# Patient Record
Sex: Male | Born: 1970 | Race: Black or African American | Hispanic: No | Marital: Single | Smoking: Never smoker
Health system: Southern US, Community
[De-identification: ages and names within clinical notes are randomized; demographics above are authoritative.]

## PROBLEM LIST (undated history)

## (undated) HISTORY — PX: COLONOSCOPY: SHX174

## (undated) HISTORY — PX: MOUTH SURGERY: SHX715

---

## 1999-04-30 ENCOUNTER — Encounter: Payer: Self-pay | Admitting: Emergency Medicine

## 1999-04-30 ENCOUNTER — Emergency Department (HOSPITAL_COMMUNITY): Admission: EM | Admit: 1999-04-30 | Discharge: 1999-04-30 | Payer: Self-pay | Admitting: Emergency Medicine

## 1999-10-03 ENCOUNTER — Emergency Department (HOSPITAL_COMMUNITY): Admission: EM | Admit: 1999-10-03 | Discharge: 1999-10-03 | Payer: Self-pay | Admitting: Emergency Medicine

## 2004-01-12 ENCOUNTER — Observation Stay (HOSPITAL_COMMUNITY): Admission: AC | Admit: 2004-01-12 | Discharge: 2004-01-13 | Payer: Self-pay
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
## 2007-05-03 ENCOUNTER — Emergency Department (HOSPITAL_COMMUNITY): Admission: EM | Admit: 2007-05-03 | Discharge: 2007-05-03 | Payer: Self-pay | Admitting: Emergency Medicine

## 2010-01-20 ENCOUNTER — Emergency Department (HOSPITAL_COMMUNITY): Admission: EM | Admit: 2010-01-20 | Discharge: 2010-01-20 | Payer: Self-pay | Admitting: Emergency Medicine
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
## 2011-05-07 ENCOUNTER — Inpatient Hospital Stay (INDEPENDENT_AMBULATORY_CARE_PROVIDER_SITE_OTHER)
Admission: RE | Admit: 2011-05-07 | Discharge: 2011-05-07 | Disposition: A | Discharge status: Home / Self Care | Payer: Self-pay | Source: Ambulatory Visit | Attending: Family Medicine | Admitting: Family Medicine

## 2011-05-07 DIAGNOSIS — T7840XA Allergy, unspecified, initial encounter: Secondary | ICD-10-CM

## 2014-09-15 ENCOUNTER — Encounter (HOSPITAL_COMMUNITY): Payer: Self-pay | Admitting: Emergency Medicine

## 2014-09-15 ENCOUNTER — Emergency Department (INDEPENDENT_AMBULATORY_CARE_PROVIDER_SITE_OTHER)
Admission: EM | Admit: 2014-09-15 | Discharge: 2014-09-15 | Disposition: A | Discharge status: Home / Self Care | Payer: Self-pay | Source: Home / Self Care | Attending: Family Medicine | Admitting: Family Medicine

## 2014-09-15 DIAGNOSIS — M62830 Muscle spasm of back: Secondary | ICD-10-CM

## 2014-09-15 LAB — POCT I-STAT, CHEM 8
BUN: 21 mg/dL (ref 6–23)
CREATININE: 1.3 mg/dL (ref 0.50–1.35)
Calcium, Ion: 1.19 mmol/L (ref 1.12–1.23)
Chloride: 101 mEq/L (ref 96–112)
GLUCOSE: 85 mg/dL (ref 70–99)
HCT: 48 % (ref 39.0–52.0)
HEMOGLOBIN: 16.3 g/dL (ref 13.0–17.0)
Potassium: 3.7 mmol/L (ref 3.5–5.1)
Sodium: 140 mmol/L (ref 135–145)
TCO2: 24 mmol/L (ref 0–100)

## 2014-09-15 MED ORDER — KETOROLAC TROMETHAMINE 60 MG/2ML IM SOLN
INTRAMUSCULAR | Status: AC
  Filled 2014-09-15: qty 2

## 2014-09-15 MED ORDER — KETOROLAC TROMETHAMINE 60 MG/2ML IM SOLN
60.0000 mg | Freq: Once | INTRAMUSCULAR | Status: AC
  Administered 2014-09-15: 60 mg via INTRAMUSCULAR

## 2014-09-15 MED ORDER — METHOCARBAMOL 500 MG PO TABS
500.0000 mg | ORAL_TABLET | Freq: Four times a day (QID) | ORAL | Status: DC | PRN
Start: 2014-09-15 — End: 2014-09-15

## 2014-09-15 MED ORDER — METHOCARBAMOL 500 MG PO TABS: 500.0000 mg | ORAL_TABLET | Freq: Four times a day (QID) | ORAL | Status: DC | PRN

## 2014-09-15 NOTE — Discharge Instructions (Signed)
You are suffering from back spasms This will take several days to resolve  Stay active and remember if it hurts continue, if it hurts a lot stop. Try applying heat and cold, massage, and stretch multiple times per day.  You were given a shot of toradol for the pain and inflammation In 24hours start taking ibuprofen (or advil) 600-800mg  every 6-8 hours Please take the muscle relaxer for the spasms. This may make you tired.

## 2014-09-15 NOTE — ED Provider Notes (Signed)
CSN: [id number]     Arrival date & time 09/15/14  1624 History   First MD Initiated Contact with Patient 09/15/14 1632     Chief Complaint  Patient presents with  . Back Pain   (Consider location/radiation/quality/duration/timing/severity/associated sxs/prior Treatment) HPI  Lower back pain: lifting furniture for mother. Pain come on acutely after putting down a piece of furniture. Occurred around 14:30. Minimally improved since onset. Worse w/ certain movements. Has not taking anything for the pain. Used Icy hot w/ benefit. Non-radiating pain. Worse w/ deep breathing. No h/o previous injury.   History reviewed. No pertinent past medical history. History reviewed. No pertinent past surgical history. Family History  Problem Relation Age of Onset  . Cancer Neg Hx   . Diabetes Neg Hx   . Hyperlipidemia Neg Hx   . Heart failure Neg Hx    History  Substance Use Topics  . Smoking status: Never Smoker   . Smokeless tobacco: Not on file  . Alcohol Use: No    Review of Systems   Per HPI with all other pertinent systems negative.    Allergies  Review of patient's allergies indicates no known allergies.  Home Medications   Prior to Admission medications   Medication Sig Start Date End Date Taking? Authorizing Provider  methocarbamol (ROBAXIN) 500 MG tablet Take 1-2 tablets (500-1,000 mg total) by mouth every 6 (six) hours as needed for muscle spasms. 09/15/14   Ozella Rocksavid J Yatzari Jonsson, MD   BP 120/58 mmHg  Pulse 73  Temp(Src) 99.2 F (37.3 C) (Oral)  Resp 16  SpO2 100% Physical Exam  Constitutional: He appears well-developed and well-nourished.  HENT:  Head: Normocephalic and atraumatic.  Eyes: EOM are normal. Pupils are equal, round, and reactive to light.  Neck: Normal range of motion.  Cardiovascular: Normal rate and normal heart sounds.   No murmur heard. Pulmonary/Chest: Effort normal and breath sounds normal.  Abdominal: Soft. Bowel sounds are normal.  Musculoskeletal:   Back FROM but difficulty moving from full flexion to sitting up right. R lower thoracic perspinal muscle ttp and spasms felt.   Neurological: He is alert.  Skin: Skin is warm.  Psychiatric: He has a normal mood and affect. Thought content normal.    ED Course  Procedures (including critical care time) Labs Review Labs Reviewed  POCT I-STAT, CHEM 8    Imaging Review No results found.   MDM   1. Back spasm    Chem 8 - nml renal fxn toadol 60mg  im  Start robaxin Restart NSAIDs in 24 hrs Massage, heat, stretching, return to activity Precautions given and all questions answered  Shelly Flattenavid Eliezer Khawaja, MD Family Medicine 09/15/2014, 5:07 PM      Ozella Rocksavid J Zierra Laroque, MD 09/15/14 [phone]

## 2014-09-15 NOTE — ED Notes (Signed)
Patient was helping move furniture today, noticed gradually getting more and more sore in back.  Difficulty walking, no back pain, no numbness, no tingling, denies urinary symptoms, denies any bowel issues

## 2017-11-22 ENCOUNTER — Ambulatory Visit (HOSPITAL_COMMUNITY)
Admission: EM | Admit: 2017-11-22 | Discharge: 2017-11-22 | Disposition: A | Discharge status: Home / Self Care | Payer: Self-pay | Attending: Family Medicine | Admitting: Family Medicine

## 2017-11-22 ENCOUNTER — Other Ambulatory Visit: Payer: Self-pay

## 2017-11-22 ENCOUNTER — Encounter (HOSPITAL_COMMUNITY): Payer: Self-pay | Admitting: Emergency Medicine

## 2017-11-22 DIAGNOSIS — B360 Pityriasis versicolor: Secondary | ICD-10-CM

## 2017-11-22 MED ORDER — ITRACONAZOLE 200 MG PO TABS: 200.0000 mg | ORAL_TABLET | Freq: Every day | ORAL | 0 refills | Status: DC

## 2017-11-22 NOTE — ED Triage Notes (Signed)
Pt has a rash on both arms that has recurred over the last few years.

## 2017-11-22 NOTE — ED Provider Notes (Signed)
[facility]   [id number] 11/22/17 Arrival Time: 1008  ASSESSMENT & PLAN:  1. Tinea versicolor     Meds ordered this encounter  Medications  . Itraconazole 200 MG TABS    Sig: Take 200 mg by mouth daily.    Dispense:  5 tablet    Refill:  0   Continue to use Selsun Blue shampoo a few times a week. Discussed that it may take awhile for his skin to change. Reviewed expectations re: course of current medical issues. Questions answered. Outlined signs and symptoms indicating need for more acute intervention. Patient verbalized understanding. After Visit Summary given.   SUBJECTIVE:  Paul Hancock is a 47 y.o. male who presents with a skin complaint.   Location: back, torso, bilateral arms Onset: gradual, on/off for a couple of years; notices more in the summertime Pruritic? mild at times Painful? No Progression: stable  Drainage? No  Known trigger? No  New soaps/lotions/topicals/detergents? No Environmental exposures or allergies? none Contacts with similar? No Recent travel? No  Other associated symptoms: none Therapies tried thus far: occasionally uses [employer] but questions if this helps; does not use regularly Denies fever. No specific aggravating or alleviating factors reported.  ROS: As per HPI.  OBJECTIVE: Vitals:   11/22/17 1042  BP: 133/81  Pulse: (!) 57  Temp: 98.3 F (36.8 C)  TempSrc: Oral  SpO2: 98%    General appearance: alert; no distress Lungs: clear to auscultation bilaterally Heart: regular rate and rhythm Extremities: no edema Skin: warm and dry; pale and coppery brown discoloured patches over his torso, chest, and arms Psychological: alert and cooperative; normal mood and affect  No Known Allergies   Social History   Socioeconomic History  . Marital status: Single    Spouse name: Not on file  . Number of children: Not on file  . Years of education: Not on file  . Highest education level: Not on file    Occupational History  . Not on file  Social Needs  . Financial resource strain: Not on file  . Food insecurity:    Worry: Not on file    Inability: Not on file  . Transportation needs:    Medical: Not on file    Non-medical: Not on file  Tobacco Use  . Smoking status: Never Smoker  . Smokeless tobacco: Never Used  Substance and Sexual Activity  . Alcohol use: Yes  . Drug use: Yes    Types: Marijuana  . Sexual activity: Not on file  Lifestyle  . Physical activity:    Days per week: Not on file    Minutes per session: Not on file  . Stress: Not on file  Relationships  . Social connections:    Talks on phone: Not on file    Gets together: Not on file    Attends religious service: Not on file    Active member of club or organization: Not on file    Attends meetings of clubs or organizations: Not on file    Relationship status: Not on file  . Intimate partner violence:    Fear of current or ex partner: Not on file    Emotionally abused: Not on file    Physically abused: Not on file    Forced sexual activity: Not on file  Other Topics Concern  . Not on file  Social History Narrative  . Not on file   Family History  Problem Relation Age of Onset  . Cancer Neg  Hx   . Diabetes Neg Hx   . Hyperlipidemia Neg Hx   . Heart failure Neg Hx    History reviewed. No pertinent surgical history.   Mardella LaymanHagler, Arham Symmonds, MD 11/22/17 [phone]

## 2018-08-07 ENCOUNTER — Ambulatory Visit (INDEPENDENT_AMBULATORY_CARE_PROVIDER_SITE_OTHER): Payer: Self-pay

## 2018-08-07 ENCOUNTER — Other Ambulatory Visit: Payer: Self-pay

## 2018-08-07 ENCOUNTER — Encounter (HOSPITAL_COMMUNITY): Payer: Self-pay | Admitting: Emergency Medicine

## 2018-08-07 ENCOUNTER — Ambulatory Visit (HOSPITAL_COMMUNITY)
Admission: EM | Admit: 2018-08-07 | Discharge: 2018-08-07 | Disposition: A | Discharge status: Home / Self Care | Payer: Self-pay | Attending: Family Medicine | Admitting: Family Medicine

## 2018-08-07 DIAGNOSIS — K5900 Constipation, unspecified: Secondary | ICD-10-CM | POA: Insufficient documentation

## 2018-08-07 DIAGNOSIS — K6289 Other specified diseases of anus and rectum: Secondary | ICD-10-CM | POA: Insufficient documentation

## 2018-08-07 DIAGNOSIS — R369 Urethral discharge, unspecified: Secondary | ICD-10-CM

## 2018-08-07 DIAGNOSIS — Z113 Encounter for screening for infections with a predominantly sexual mode of transmission: Secondary | ICD-10-CM | POA: Insufficient documentation

## 2018-08-07 LAB — POCT URINALYSIS DIP (DEVICE)
BILIRUBIN URINE: NEGATIVE
GLUCOSE, UA: NEGATIVE mg/dL
Hgb urine dipstick: NEGATIVE
LEUKOCYTES UA: NEGATIVE
NITRITE: NEGATIVE
Protein, ur: NEGATIVE mg/dL
Specific Gravity, Urine: >=1.03 (ref 1.005–1.030)
UROBILINOGEN UA: 0.2 mg/dL (ref 0.0–1.0)
pH: 6 (ref 5.0–8.0)

## 2018-08-07 LAB — OCCULT BLOOD, POC DEVICE: FECAL OCCULT BLD: NEGATIVE

## 2018-08-07 MED ORDER — AZITHROMYCIN 250 MG PO TABS
ORAL_TABLET | ORAL | Status: AC
  Filled 2018-08-07: qty 4

## 2018-08-07 MED ORDER — CEFTRIAXONE SODIUM 250 MG IJ SOLR
250.0000 mg | Freq: Once | INTRAMUSCULAR | Status: AC
  Administered 2018-08-07: 250 mg via INTRAMUSCULAR

## 2018-08-07 MED ORDER — HYDROCORTISONE ACETATE 25 MG RE SUPP: 25.0000 mg | Freq: Two times a day (BID) | RECTAL | 0 refills | Status: DC

## 2018-08-07 MED ORDER — AZITHROMYCIN 250 MG PO TABS
1000.0000 mg | ORAL_TABLET | Freq: Once | ORAL | Status: AC
  Administered 2018-08-07: 1000 mg via ORAL

## 2018-08-07 MED ORDER — CEFTRIAXONE SODIUM 250 MG IJ SOLR
INTRAMUSCULAR | Status: AC
  Filled 2018-08-07: qty 250

## 2018-08-07 NOTE — ED Triage Notes (Signed)
Last bm today around 3 pm. Felt a lot of rectal pressure and stool was firm.  Onset of symptoms on e week ago.  Patient took constipation medicines.    Concerned for std.  Patient reports penile discharge 2 days ago

## 2018-08-07 NOTE — ED Notes (Signed)
Sent for urine specimen °

## 2018-08-07 NOTE — Discharge Instructions (Addendum)
Please use anusol-hc suppositories in rectum twice daily as needed for pain Please follow up with gastroenterology for further evaluation of pain If pain worsening please go to emergency room for further evaluation/imaging of.  Rectum You may try sitz bath's, sit in warm water for approximately 30 minutes

## 2018-08-08 LAB — URINE CYTOLOGY ANCILLARY ONLY
Chlamydia: NEGATIVE
NEISSERIA GONORRHEA: NEGATIVE
Trichomonas: NEGATIVE

## 2018-08-09 NOTE — ED Provider Notes (Signed)
[facility]    CSN: [id number] Arrival date & time: 08/07/18  1616     History   Chief Complaint Chief Complaint  Patient presents with  . SEXUALLY TRANSMITTED DISEASE  . Constipation    HPI Paul Hancock is a 47 y.o. male no significant past medical history presenting today for evaluation of rectal pain and STD screening.  Patient states that he has had a lot of pain and pressure around his rectum.  He has felt his stool has been hard and small.  He has had to strain a lot recently.  He has noticed this straining since Thanksgiving over the past 1 to 2 weeks.  He has tried stool softeners, laxatives, castor oil, Gas-X eating greens as well as increasing water intake which has not helped this sensation.  He feels as if the pain is getting worse.  He has noticed some blood in the stools.  Denies nausea or vomiting.  Denies abdominal pain.  Denies worsening pain with eating or drinking.  Denies any drainage.  Denies any change in skin to this area.  Patient has also had penile discharge.  He noticed this 2 days ago but has not been persistent.  He is concerned about STDs because of this.  She is unsure if STDs could be related to the rectal pressure as well.  He denies dysuria, increased frequency or hematuria. HPI  History reviewed. No pertinent past medical history.  There are no active problems to display for this patient.   History reviewed. No pertinent surgical history.     Home Medications    Prior to Admission medications   Medication Sig Start Date End Date Taking? Authorizing Provider  hydrocortisone (ANUSOL-HC) 25 MG suppository Place 1 suppository (25 mg total) rectally 2 (two) times daily. 08/07/18   Haskell Rihn, Junius Creamer, PA-C    Family History Family History  Problem Relation Age of Onset  . Cancer Neg Hx   . Diabetes Neg Hx   . Hyperlipidemia Neg Hx   . Heart failure Neg Hx     Social History Social History   Tobacco Use  . Smoking status:  Never Smoker  . Smokeless tobacco: Never Used  Substance Use Topics  . Alcohol use: Yes  . Drug use: Yes    Types: Marijuana     Allergies   Patient has no known allergies.   Review of Systems Review of Systems  Constitutional: Negative for fever.  HENT: Negative for sore throat.   Respiratory: Negative for shortness of breath.   Cardiovascular: Negative for chest pain.  Gastrointestinal: Positive for blood in stool, constipation and rectal pain. Negative for abdominal pain, diarrhea, nausea and vomiting.  Genitourinary: Positive for discharge. Negative for difficulty urinating, dysuria, frequency, penile pain, penile swelling, scrotal swelling and testicular pain.  Skin: Negative for rash.  Neurological: Negative for dizziness, light-headedness and headaches.     Physical Exam Triage Vital Signs ED Triage Vitals  Enc Vitals Group     BP 08/07/18 1729 139/83     Pulse Rate 08/07/18 1729 68     Resp 08/07/18 1729 20     Temp 08/07/18 1729 97.9 F (36.6 C)     Temp Source 08/07/18 1729 Oral     SpO2 08/07/18 1729 97 %     Weight --      Height --      Head Circumference --      Peak Flow --      Pain  Score 08/07/18 1726 8     Pain Loc --      Pain Edu? --      Excl. in GC? --    No data found.  Updated Vital Signs BP 139/83 (BP Location: Right Arm)   Pulse 68   Temp 97.9 F (36.6 C) (Oral)   Resp 20   SpO2 97%   Visual Acuity Right Eye Distance:   Left Eye Distance:   Bilateral Distance:    Right Eye Near:   Left Eye Near:    Bilateral Near:     Physical Exam  Constitutional: He appears well-developed and well-nourished.  HENT:  Head: Normocephalic and atraumatic.  Eyes: Conjunctivae are normal.  Neck: Neck supple.  Cardiovascular: Normal rate and regular rhythm.  No murmur heard. Pulmonary/Chest: Effort normal and breath sounds normal. No respiratory distress.  Abdominal: Soft. There is no tenderness.  Abdomen soft, nondistended, nontender to  light deep palpation throughout abdomen, no palpable masses  Genitourinary:  Genitourinary Comments: External rectum normal, no surrounding induration or fluctuance noted, no external hemorrhoids, tenderness to palpation on rectum On DRE significant discomfort, tenderness to palpation to posterior aspect of rectal wall as well as right lateral aspect, nontender over prostate or left lateral wall  Musculoskeletal: He exhibits no edema.  Neurological: He is alert.  Skin: Skin is warm and dry.  Psychiatric: He has a normal mood and affect.  Nursing note and vitals reviewed.    UC Treatments / Results  Labs (all labs ordered are listed, but only abnormal results are displayed) Labs Reviewed  POCT URINALYSIS DIP (DEVICE) - Abnormal; Notable for the following components:      Result Value   Ketones, ur TRACE (*)    All other components within normal limits  POC OCCULT BLOOD, ED  OCCULT BLOOD, POC DEVICE  URINE CYTOLOGY ANCILLARY ONLY    EKG None  Radiology Dg Abd 2 Views  Result Date: 08/07/2018 CLINICAL DATA:  Rectal pain and constipation for the past 2 weeks. EXAM: ABDOMEN - 2 VIEW COMPARISON:  None. FINDINGS: Normal bowel gas pattern without free peritoneal air. Normal amount of stool. Minimal scoliosis. IMPRESSION: No acute abnormality. Electronically Signed   By: Beckie Salts M.D.   On: 08/07/2018 19:42    Procedures Procedures (including critical care time)  Medications Ordered in UC Medications  azithromycin (ZITHROMAX) tablet 1,000 mg (1,000 mg Oral Given 08/07/18 1914)  cefTRIAXone (ROCEPHIN) injection 250 mg (250 mg Intramuscular Given 08/07/18 1914)    Initial Impression / Assessment and Plan / UC Course  I have reviewed the triage vital signs and the nursing notes.  Pertinent labs & imaging results that were available during my care of the patient were reviewed by me and considered in my medical decision making (see chart for details).     Hemoccult negative, no  significant constipation on abdominal x-ray.  UA negative for signs of UTI, will send off for urine cytology.  Will empirically treat given penile discharge and new sexual partner.  Discussed with patient possibilities of discomfort including internal hemorrhoids, anal fissure, perirectal abscess.  Discussed internal hemorrhoids less likely given typically do not cause pain.  Will treat with Anusol HC to help with any fissure.  Recommended to continue constipation recommendations in order to keep stool on the softer area and to help with passing stools.  If pain not improving with suppositories and having worsening pain or developing any drainage to go to emergency room for evaluation of perirectal  abscess.  Follow-up with gastroenterology if symptoms persisting possibly have scope to visualize source of discomfort.Discussed strict return precautions. Patient verbalized understanding and is agreeable with plan.  Final Clinical Impressions(s) / UC Diagnoses   Final diagnoses:  Rectal pain     Discharge Instructions     Please use anusol-hc suppositories in rectum twice daily as needed for pain Please follow up with gastroenterology for further evaluation of pain If pain worsening please go to emergency room for further evaluation/imaging of.  Rectum You may try sitz bath's, sit in warm water for approximately 30 minutes   ED Prescriptions    Medication Sig Dispense Auth. Provider   hydrocortisone (ANUSOL-HC) 25 MG suppository Place 1 suppository (25 mg total) rectally 2 (two) times daily. 16 suppository Lavoris Sparling C, PA-C     Controlled Substance Prescriptions [place] Controlled Substance Registry consulted? Not Applicable   Lew DawesWieters, Deavin Forst C, [place]PA-C 08/09/18 1007

## 2019-10-19 ENCOUNTER — Other Ambulatory Visit: Payer: Self-pay

## 2019-10-19 ENCOUNTER — Encounter (HOSPITAL_COMMUNITY): Payer: Self-pay

## 2019-10-19 ENCOUNTER — Ambulatory Visit (HOSPITAL_COMMUNITY)
Admission: EM | Admit: 2019-10-19 | Discharge: 2019-10-19 | Disposition: A | Discharge status: Home / Self Care | Payer: 59 | Attending: Family Medicine | Admitting: Family Medicine

## 2019-10-19 DIAGNOSIS — M7711 Lateral epicondylitis, right elbow: Secondary | ICD-10-CM | POA: Diagnosis not present

## 2019-10-19 MED ORDER — PREDNISONE 10 MG PO TABS: ORAL_TABLET | ORAL | 0 refills | Status: DC

## 2019-10-19 NOTE — Discharge Instructions (Signed)
Begin prednisone taper over the next 6 days, begin with 6 tablets, decrease by 1 tablet each day-6, 5, 4, 3, 2, 1 Take with food in the morning if you are able After prednisone Use anti-inflammatories for pain/swelling. You may take up to 800 mg Ibuprofen every 8 hours with food. You may supplement Ibuprofen with Tylenol [phone] mg every 8 hours.  Please wear an elbow strap to help limit tension on tendons at elbow Ice Avoid triggering motions Follow-up if not improving or worsening

## 2019-10-19 NOTE — ED Triage Notes (Signed)
Pt reports having right elbow pain x 1 month. Pt reports his right elbow feels weak. The first time he noticed the elbow pain was a work, as he was picking up some heavy pipes.

## 2019-10-20 NOTE — ED Provider Notes (Signed)
[facility]    CSN: [id number] Arrival date & time: 10/19/19  1752      History   Chief Complaint Chief Complaint  Patient presents with  . Elbow Pain    HPI Paul Hancock is a 49 y.o. male no significant past medical history presenting today for evaluation of right elbow pain.  Patient states that over the past month he has had persistent pain around his elbow.  Denies any injury fall or trauma to the elbow.  Feels increased pain with moving elbow, some pain with moving wrist.  He notes that he does a lot physical work involving shoveling and using his arms a lot.  Patient is left-handed.  Has not used any medicines for his symptoms.  HPI  History reviewed. No pertinent past medical history.  There are no problems to display for this patient.   History reviewed. No pertinent surgical history.     Home Medications    Prior to Admission medications   Medication Sig Start Date End Date Taking? Authorizing Provider  predniSONE (DELTASONE) 10 MG tablet Begin with 6 tabs on day 1, 5 tab on day 2, 4 tab on day 3, 3 tab on day 4, 2 tab on day 5, 1 tab on day 6-take with food 10/19/19   Wieters, [place] C, PA-C    Family History Family History  Problem Relation Age of Onset  . Cancer Neg Hx   . Diabetes Neg Hx   . Hyperlipidemia Neg Hx   . Heart failure Neg Hx     Social History Social History   Tobacco Use  . Smoking status: Never Smoker  . Smokeless tobacco: Never Used  Substance Use Topics  . Alcohol use: Yes  . Drug use: Yes    Types: Marijuana     Allergies   Patient has no known allergies.   Review of Systems Review of Systems  Constitutional: Negative for fatigue and fever.  Eyes: Negative for redness, itching and visual disturbance.  Respiratory: Negative for shortness of breath.   Cardiovascular: Negative for chest pain and leg swelling.  Gastrointestinal: Negative for nausea and vomiting.  Musculoskeletal: Positive for  arthralgias and myalgias.  Skin: Negative for color change, rash and wound.  Neurological: Negative for dizziness, syncope, weakness, light-headedness and headaches.     Physical Exam Triage Vital Signs ED Triage Vitals  Enc Vitals Group     BP 10/19/19 1809 130/68     Pulse Rate 10/19/19 1809 71     Resp 10/19/19 1809 18     Temp 10/19/19 1809 98.9 F (37.2 C)     Temp Source 10/19/19 1809 Oral     SpO2 10/19/19 1809 97 %     Weight --      Height --      Head Circumference --      Peak Flow --      Pain Score 10/19/19 1808 6     Pain Loc --      Pain Edu? --      Excl. in GC? --    No data found.  Updated Vital Signs BP 130/68 (BP Location: Left Arm)   Pulse 71   Temp 98.9 F (37.2 C) (Oral)   Resp 18   SpO2 97%   Visual Acuity Right Eye Distance:   Left Eye Distance:   Bilateral Distance:    Right Eye Near:   Left Eye Near:    Bilateral Near:  Physical Exam Vitals and nursing note reviewed.  Constitutional:      Appearance: He is well-developed.     Comments: No acute distress  HENT:     Head: Normocephalic and atraumatic.     Nose: Nose normal.  Eyes:     Conjunctiva/sclera: Conjunctivae normal.  Cardiovascular:     Rate and Rhythm: Normal rate.  Pulmonary:     Effort: Pulmonary effort is normal. No respiratory distress.  Abdominal:     General: There is no distension.  Musculoskeletal:        General: Normal range of motion.     Cervical back: Neck supple.     Comments: Right elbow: No obvious swelling deformity or discoloration, no erythema or warmth, mild tenderness to palpation over lateral and medial epicondyle areas, more prominent on lateral aspect, full active range of motion at elbow, nontender to palpation of distal radius and ulna, radial pulse 2+  Skin:    General: Skin is warm and dry.  Neurological:     Mental Status: He is alert and oriented to person, place, and time.      UC Treatments / Results  Labs (all labs ordered  are listed, but only abnormal results are displayed) Labs Reviewed - No data to display  EKG   Radiology No results found.  Procedures Procedures (including critical care time)  Medications Ordered in UC Medications - No data to display  Initial Impression / Assessment and Plan / UC Course  I have reviewed the triage vital signs and the nursing notes.  Pertinent labs & imaging results that were available during my care of the patient were reviewed by me and considered in my medical decision making (see chart for details).     History and exam suggestive of likely epicondylitis/tendinitis.  No mechanism of injury, full active range of motion at elbow, do not suspect underlying fracture or acute bony abnormality.  Deferring imaging.  Providing elbow strap and recommending course of prednisone followed by NSAIDs.  May have a combination of both lateral and medial epicondylitis, but more prominent lateral.  Recommending rest if possible.  Discussed strict return precautions. Patient verbalized understanding and is agreeable with plan.  Final Clinical Impressions(s) / UC Diagnoses   Final diagnoses:  Lateral epicondylitis of right elbow     Discharge Instructions     Begin prednisone taper over the next 6 days, begin with 6 tablets, decrease by 1 tablet each day-6, 5, 4, 3, 2, 1 Take with food in the morning if you are able After prednisone Use anti-inflammatories for pain/swelling. You may take up to 800 mg Ibuprofen every 8 hours with food. You may supplement Ibuprofen with Tylenol [phone] mg every 8 hours.  Please wear an elbow strap to help limit tension on tendons at elbow Ice Avoid triggering motions Follow-up if not improving or worsening   ED Prescriptions    Medication Sig Dispense Auth. Provider   predniSONE (DELTASONE) 10 MG tablet Begin with 6 tabs on day 1, 5 tab on day 2, 4 tab on day 3, 3 tab on day 4, 2 tab on day 5, 1 tab on day 6-take with food 21 tablet  Wieters, Hallie C, PA-C     PDMP not reviewed this encounter.   Janith Lima, [place] 10/20/19 [phone]

## 2020-02-26 ENCOUNTER — Ambulatory Visit (HOSPITAL_COMMUNITY)
Admission: EM | Admit: 2020-02-26 | Discharge: 2020-02-26 | Disposition: A | Discharge status: Home / Self Care | Payer: 59 | Attending: Family Medicine | Admitting: Family Medicine

## 2020-02-26 ENCOUNTER — Encounter (HOSPITAL_COMMUNITY): Payer: Self-pay | Admitting: Emergency Medicine

## 2020-02-26 ENCOUNTER — Other Ambulatory Visit: Payer: Self-pay

## 2020-02-26 DIAGNOSIS — L259 Unspecified contact dermatitis, unspecified cause: Secondary | ICD-10-CM | POA: Diagnosis not present

## 2020-02-26 MED ORDER — TRIAMCINOLONE ACETONIDE 0.1 % EX CREA: 1.0000 "application " | TOPICAL_CREAM | Freq: Two times a day (BID) | CUTANEOUS | 0 refills | Status: DC

## 2020-02-26 MED ORDER — PREDNISONE 10 MG (21) PO TBPK: ORAL_TABLET | Freq: Every day | ORAL | 0 refills | Status: DC

## 2020-02-26 NOTE — ED Triage Notes (Signed)
Pt c/o rash on bilateral hands and in ear. He states today it has gotten worse when he was wearing gloves today. Pt states ear has been oozing some pus.

## 2020-02-27 NOTE — ED Provider Notes (Signed)
[facility]   [id number] 02/26/20 Arrival Time: 1648  ASSESSMENT & PLAN:  1. Contact dermatitis, unspecified contact dermatitis type, unspecified trigger     Suspect rhus.  Meds ordered this encounter  Medications  . predniSONE (STERAPRED UNI-PAK 21 TAB) 10 MG (21) TBPK tablet    Sig: Take by mouth daily. Take as directed.    Dispense:  21 tablet    Refill:  0  . triamcinolone cream (KENALOG) 0.1 %    Sig: Apply 1 application topically 2 (two) times daily.    Dispense:  30 g    Refill:  0     Will follow up with PCP or here if worsening or failing to improve as anticipated. Reviewed expectations re: course of current medical issues. Questions answered. Outlined signs and symptoms indicating need for more acute intervention. Patient verbalized understanding. After Visit Summary given.   SUBJECTIVE:  Paul Hancock is a 49 y.o. male who presents with a skin complaint. Questions poison ivy. First noted 2-3 d ago; bilateral wrists and left ear. Significant itching. Afebrile. No new exposures. No OTC tx reported.   OBJECTIVE: Vitals:   02/26/20 1718  BP: 116/63  Pulse: (!) 57  Resp: 19  Temp: 99.3 F (37.4 C)  TempSrc: Oral  SpO2: 98%    General appearance: alert; no distress HEENT: [place]; AT Neck: supple with FROM Extremities: no edema; moves all extremities normally Skin: warm and dry; areas of linear papules and vesicles with surrounding erythema over bilateral wrist and over external left ear Psychological: alert and cooperative; normal mood and affect  No Known Allergies  History reviewed. No pertinent past medical history. Social History   Socioeconomic History  . Marital status: Single    Spouse name: Not on file  . Number of children: Not on file  . Years of education: Not on file  . Highest education level: Not on file  Occupational History  . Not on file  Tobacco Use  . Smoking status: Never Smoker  . Smokeless tobacco: Never  Used  Substance and Sexual Activity  . Alcohol use: Yes  . Drug use: Yes    Types: Marijuana  . Sexual activity: Not on file  Other Topics Concern  . Not on file  Social History Narrative  . Not on file   Social Determinants of Health   Financial Resource Strain:   . Difficulty of Paying Living Expenses:   Food Insecurity:   . Worried About Programme researcher, broadcasting/film/video in the Last Year:   . Barista in the Last Year:   Transportation Needs:   . Freight forwarder (Medical):   Marland Kitchen Lack of Transportation (Non-Medical):   Physical Activity:   . Days of Exercise per Week:   . Minutes of Exercise per Session:   Stress:   . Feeling of Stress :   Social Connections:   . Frequency of Communication with Friends and Family:   . Frequency of Social Gatherings with Friends and Family:   . Attends Religious Services:   . Active Member of Clubs or Organizations:   . Attends Banker Meetings:   Marland Kitchen Marital Status:   Intimate Partner Violence:   . Fear of Current or Ex-Partner:   . Emotionally Abused:   Marland Kitchen Physically Abused:   . Sexually Abused:    Family History  Problem Relation Age of Onset  . Cancer Neg Hx   . Diabetes Neg Hx   . Hyperlipidemia Neg  Hx   . Heart failure Neg Hx    History reviewed. No pertinent surgical history.   Mardella Layman, MD 02/27/20 1227

## 2021-01-03 ENCOUNTER — Other Ambulatory Visit: Payer: Self-pay

## 2021-01-03 ENCOUNTER — Encounter (HOSPITAL_COMMUNITY): Payer: Self-pay | Admitting: *Deleted

## 2021-01-03 ENCOUNTER — Ambulatory Visit (HOSPITAL_COMMUNITY)
Admission: EM | Admit: 2021-01-03 | Discharge: 2021-01-03 | Disposition: A | Discharge status: Home / Self Care | Payer: 59 | Attending: Physician Assistant | Admitting: Physician Assistant

## 2021-01-03 DIAGNOSIS — B360 Pityriasis versicolor: Secondary | ICD-10-CM | POA: Diagnosis not present

## 2021-01-03 MED ORDER — CICLOPIROX OLAMINE 0.77 % EX CREA: TOPICAL_CREAM | Freq: Two times a day (BID) | CUTANEOUS | 0 refills | Status: DC

## 2021-01-03 MED ORDER — FLUCONAZOLE 150 MG PO TABS: 150.0000 mg | ORAL_TABLET | Freq: Once | ORAL | 0 refills | Status: AC

## 2021-01-03 NOTE — ED Provider Notes (Signed)
[facility]    CSN: [id number] Arrival date & time: 01/03/21  1228      History   Chief Complaint Chief Complaint  Patient presents with  . Rash    HPI Paul Hancock is a 50 y.o. male.   Patient presents today with a several week history of rash on anterior chest.  He is anxious to have symptoms improve as he is scheduled to go to [facility] next week.  He reports similar episodes in the past that required cream and pill for treatment.  He does not remember previous diagnosis.  He reports symptoms are worse when he is outside and sweating.  He reports occasional pruritus but denies any significant itching or pain.  He denies any spread of rash.  Denies any changes to personal hygiene products including soaps, detergents, medications.  Denies any recent antibiotic use.  He has not seen a dermatologist recently denies history of dermatological condition.  Denies additional symptoms including fever, shortness of breath, recent illness, nausea, vomiting.     History reviewed. No pertinent past medical history.  There are no problems to display for this patient.   History reviewed. No pertinent surgical history.     Home Medications    Prior to Admission medications   Medication Sig Start Date End Date Taking? Authorizing Provider  ciclopirox (LOPROX) 0.77 % cream Apply topically 2 (two) times daily. 01/03/21  Yes Amedeo Detweiler K, PA-C  fluconazole (DIFLUCAN) 150 MG tablet Take 1 tablet (150 mg total) by mouth once for 1 dose. 01/03/21 01/03/21 Yes Enes Rokosz, Noberto Retort, PA-C    Family History Family History  Problem Relation Age of Onset  . Cancer Neg Hx   . Diabetes Neg Hx   . Hyperlipidemia Neg Hx   . Heart failure Neg Hx     Social History Social History   Tobacco Use  . Smoking status: Never Smoker  . Smokeless tobacco: Never Used  Substance Use Topics  . Alcohol use: Yes  . Drug use: Yes    Types: Marijuana     Allergies   Patient has no known  allergies.   Review of Systems Review of Systems  Constitutional: Negative for activity change, appetite change, fatigue and fever.  Respiratory: Negative for cough and shortness of breath.   Cardiovascular: Negative for chest pain.  Gastrointestinal: Negative for abdominal pain, diarrhea, nausea and vomiting.  Skin: Positive for rash.  Neurological: Negative for dizziness, light-headedness and headaches.     Physical Exam Triage Vital Signs ED Triage Vitals  Enc Vitals Group     BP 01/03/21 1318 98/67     Pulse Rate 01/03/21 1318 (!) 57     Resp 01/03/21 1318 16     Temp 01/03/21 1318 97.9 F (36.6 C)     Temp Source 01/03/21 1318 Oral     SpO2 01/03/21 1318 98 %     Weight --      Height --      Head Circumference --      Peak Flow --      Pain Score 01/03/21 1317 0     Pain Loc --      Pain Edu? --      Excl. in GC? --    No data found.  Updated Vital Signs BP 119/64 (BP Location: Right Arm)   Pulse (!) 53   Temp 97.9 F (36.6 C) (Oral)   Resp 16   SpO2 98%   Visual Acuity Right  Eye Distance:   Left Eye Distance:   Bilateral Distance:    Right Eye Near:   Left Eye Near:    Bilateral Near:     Physical Exam Vitals reviewed.  Constitutional:      General: He is awake.     Appearance: Normal appearance. He is normal weight. He is not ill-appearing.     Comments: Very pleasant male appears stated age in no acute distress  HENT:     Head: Normocephalic and atraumatic.     Mouth/Throat:     Pharynx: No oropharyngeal exudate, posterior oropharyngeal erythema or uvula swelling.  Cardiovascular:     Rate and Rhythm: Normal rate and regular rhythm.     Heart sounds: No murmur heard.   Pulmonary:     Effort: Pulmonary effort is normal.     Breath sounds: Normal breath sounds. No stridor. No wheezing, rhonchi or rales.     Comments: Clear to auscultation bilaterally Skin:    Findings: Rash present. Rash is macular.     Comments: Multiple annular  hypopigmented macular lesions noted anterior chest.  No evidence of excoriation.  No bleeding or drainage noted.  Neurological:     Mental Status: He is alert.  Psychiatric:        Behavior: Behavior is cooperative.      UC Treatments / Results  Labs (all labs ordered are listed, but only abnormal results are displayed) Labs Reviewed - No data to display  EKG   Radiology No results found.  Procedures Procedures (including critical care time)  Medications Ordered in UC Medications - No data to display  Initial Impression / Assessment and Plan / UC Course  I have reviewed the triage vital signs and the nursing notes.  Pertinent labs & imaging results that were available during my care of the patient were reviewed by me and considered in my medical decision making (see chart for details).     Symptoms consistent with tinea versicolor.  Patient was given 1 dose of Diflucan to start treatment and will use Loprox twice daily for minimum of 2 weeks.  He was encouraged to keep affected area clean and dry.  Discussed that if symptoms persist or spread he will need to see dermatology.  Strict return precautions given to which patient expressed understanding.  Final Clinical Impressions(s) / UC Diagnoses   Final diagnoses:  Tinea versicolor     Discharge Instructions     Take Diflucan once.  Use cream twice daily for minimum of 2 weeks.  Keep affected area clean and dry.  If you have any spread of rash or additional symptoms you need to be reevaluated.    ED Prescriptions    Medication Sig Dispense Auth. Provider   fluconazole (DIFLUCAN) 150 MG tablet Take 1 tablet (150 mg total) by mouth once for 1 dose. 1 tablet Hennesy Sobalvarro K, PA-C   ciclopirox (LOPROX) 0.77 % cream Apply topically 2 (two) times daily. 60 g Marissa Lowrey, Noberto Retort, PA-C     PDMP not reviewed this encounter.   Jeani Hawking, PA-C 01/03/21 1403

## 2021-01-03 NOTE — Discharge Instructions (Signed)
Take Diflucan once.  Use cream twice daily for minimum of 2 weeks.  Keep affected area clean and dry.  If you have any spread of rash or additional symptoms you need to be reevaluated.

## 2021-01-03 NOTE — ED Triage Notes (Signed)
Pt reports a rash on chest.

## 2021-02-09 ENCOUNTER — Other Ambulatory Visit: Payer: Self-pay

## 2021-02-09 ENCOUNTER — Ambulatory Visit (HOSPITAL_COMMUNITY): Admission: EM | Admit: 2021-02-09 | Discharge: 2021-02-09 | Discharge status: Against Medical Advice | Payer: 59

## 2021-02-16 ENCOUNTER — Other Ambulatory Visit: Payer: Self-pay

## 2021-02-16 ENCOUNTER — Encounter (HOSPITAL_COMMUNITY): Payer: Self-pay

## 2021-02-16 ENCOUNTER — Ambulatory Visit (HOSPITAL_COMMUNITY)
Admission: EM | Admit: 2021-02-16 | Discharge: 2021-02-16 | Disposition: A | Discharge status: Home / Self Care | Payer: 59 | Attending: Urgent Care | Admitting: Urgent Care

## 2021-02-16 DIAGNOSIS — R102 Pelvic and perineal pain: Secondary | ICD-10-CM | POA: Diagnosis not present

## 2021-02-16 DIAGNOSIS — R3916 Straining to void: Secondary | ICD-10-CM | POA: Insufficient documentation

## 2021-02-16 DIAGNOSIS — R195 Other fecal abnormalities: Secondary | ICD-10-CM | POA: Diagnosis not present

## 2021-02-16 LAB — POCT URINALYSIS DIPSTICK, ED / UC
Bilirubin Urine: NEGATIVE
Glucose, UA: NEGATIVE mg/dL
Hgb urine dipstick: NEGATIVE
Ketones, ur: NEGATIVE mg/dL
Leukocytes,Ua: NEGATIVE
Nitrite: NEGATIVE
Protein, ur: NEGATIVE mg/dL
Specific Gravity, Urine: >=1.03 (ref 1.005–1.030)
Urobilinogen, UA: 1 mg/dL (ref 0.0–1.0)
pH: 6.5 (ref 5.0–8.0)

## 2021-02-16 MED ORDER — TAMSULOSIN HCL 0.4 MG PO CAPS: 0.4000 mg | ORAL_CAPSULE | Freq: Every day | ORAL | 0 refills | Status: DC

## 2021-02-16 MED ORDER — NAPROXEN 500 MG PO TABS: 500.0000 mg | ORAL_TABLET | Freq: Two times a day (BID) | ORAL | 0 refills | Status: DC

## 2021-02-16 NOTE — ED Triage Notes (Signed)
Pt presents for STD testing after having some groin pain and issues with urinating for the past few days.

## 2021-02-16 NOTE — Discharge Instructions (Addendum)
Once your results are ready we can decide if you end up needing an antibiotic for an infection.  In the meantime please contact [facility] to get a consultation on your prostate.  You could start using naproxen for pain and inflammation and tamsulosin to help your body urinate better without straining.

## 2021-02-16 NOTE — ED Provider Notes (Signed)
[facility]   MRN: [id number] DOB: 10/27/1970  Subjective:   Paul Hancock is a 50 y.o. male presenting for several day history of cute onset pelvic pain, perianal pain, urinary straining, hard stools.  Patient was seen at another practice last week and was advised that he had hemorrhoids.  Today his primary concern is that he has a sexually transmitted infection.  Denies fever, nausea, vomiting, dysuria, penile discharge, testicular pain and swelling.  He is sexually active, does not always use condoms for protection.  Does not have a primary care doctor.  States that the constipation and hard stools are new for him.  He has tried to make dietary changes.  Has never had his prostate or colon check.  No current facility-administered medications for this encounter.  Current Outpatient Medications:    ciclopirox (LOPROX) 0.77 % cream, Apply topically 2 (two) times daily., Disp: 60 g, Rfl: 0   No Known Allergies  History reviewed. No pertinent past medical history.   History reviewed. No pertinent surgical history.  Family History  Problem Relation Age of Onset   Cancer Neg Hx    Diabetes Neg Hx    Hyperlipidemia Neg Hx    Heart failure Neg Hx     Social History   Tobacco Use   Smoking status: Never   Smokeless tobacco: Never  Substance Use Topics   Alcohol use: Yes   Drug use: Yes    Types: Marijuana    ROS   Objective:   Vitals: BP (!) 127/95 (BP Location: Right Arm)   Pulse 73   Temp 97.8 F (36.6 C) (Oral)   Resp 20   SpO2 100%   Physical Exam Constitutional:      General: He is not in acute distress.    Appearance: Normal appearance. He is well-developed and normal weight. He is not ill-appearing, toxic-appearing or diaphoretic.  HENT:     Head: Normocephalic and atraumatic.     Right Ear: External ear normal.     Left Ear: External ear normal.     Nose: Nose normal.     Mouth/Throat:     Pharynx: Oropharynx is clear.  Eyes:      General: No scleral icterus.       Right eye: No discharge.        Left eye: No discharge.     Extraocular Movements: Extraocular movements intact.     Pupils: Pupils are equal, round, and reactive to light.  Cardiovascular:     Rate and Rhythm: Normal rate.  Pulmonary:     Effort: Pulmonary effort is normal.  Abdominal:     General: Bowel sounds are normal. There is no distension.     Palpations: Abdomen is soft. There is no mass.     Tenderness: There is no abdominal tenderness. There is no right CVA tenderness, left CVA tenderness, guarding or rebound.  Genitourinary:    Penis: Circumcised. No phimosis, paraphimosis, hypospadias, tenderness, discharge, swelling or lesions.   Musculoskeletal:     Cervical back: Normal range of motion.  Neurological:     Mental Status: He is alert and oriented to person, place, and time.  Psychiatric:        Mood and Affect: Mood normal.        Behavior: Behavior normal.        Thought Content: Thought content normal.        Judgment: Judgment normal.    Results for orders placed  or performed during the hospital encounter of 02/16/21 (from the past 24 hour(s))  POC Urinalysis dipstick     Status: None   Collection Time: 02/16/21  6:59 PM  Result Value Ref Range   Glucose, UA NEGATIVE NEGATIVE mg/dL   Bilirubin Urine NEGATIVE NEGATIVE   Ketones, ur NEGATIVE NEGATIVE mg/dL   Specific Gravity, Urine >=1.030 1.005 - 1.030   Hgb urine dipstick NEGATIVE NEGATIVE   pH 6.5 5.0 - 8.0   Protein, ur NEGATIVE NEGATIVE mg/dL   Urobilinogen, UA 1.0 0.0 - 1.0 mg/dL   Nitrite NEGATIVE NEGATIVE   Leukocytes,Ua NEGATIVE NEGATIVE    Assessment and Plan :   PDMP not reviewed this encounter.  1. Pelvic pain   2. Urinary straining   3. Hard stool     Recommended supportive care with naproxen for pain and inflammation and also attempt to loosen to help with his urinary flow.  Recommended follow-up with urologist.  We will base antibiotic treatment off  of his results.  Offered patient to try and set up his primary care but he declined, states that he will pursue this on his own.  Discussed possibility of involvement of his colon for his symptoms and needing a checkup for this including consideration for colonoscopy. Counseled patient on potential for adverse effects with medications prescribed/recommended today, ER and return-to-clinic precautions discussed, patient verbalized understanding.    Wallis Bamberg, [place] 02/16/21 1945

## 2021-02-16 NOTE — ED Notes (Signed)
patient requesting to talk to mani, pa.  Notified mani, pa

## 2021-02-17 LAB — CYTOLOGY, (ORAL, ANAL, URETHRAL) ANCILLARY ONLY
Chlamydia: NEGATIVE
Comment: NEGATIVE
Comment: NEGATIVE
Comment: NORMAL
Neisseria Gonorrhea: NEGATIVE
Trichomonas: NEGATIVE

## 2021-06-10 NOTE — Progress Notes (Signed)
 REFERRING PHYSICIAN:  Dianna Jerrell BROCKS, MD  Patient Care Team: None as PCP - General  PROVIDER:  ELSPETH JUDAH SCHULTZE, MD  [facility] MRN: [id number] DOB: [birth year] DATE OF ENCOUNTER: 06/10/2021  Subjective   Chief Complaint: No chief complaint on file.     History of Present Illness: Paul Hancock is a 50 y.o. male who is seen today as an office consultation at the request of Dr. Dianna for evaluation of No chief complaint on file. .    We made request but cannot get records yet.  No records are available.  Diagnosis is thrombosed hemorrhoid  Patient friends with Dr. Dianna.  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  Had episode of sharp pain.  Dr. Dianna recommend he consider seeing us .  Patient has had sharp pain for over the past week.  Feels like he is pooping razor blade.  It does not feel any mass popping out.  No purulence.  He usually moves his bowels about 3 times a day.  He has been trying to use stool softeners with only mild help.  No help with topical medications like Preparation H.  He has never had any prior hemorrhoidal issues that he is aware of.  He is never had a colonoscopy.  Does not smoke.  Otherwise healthy and active.  No prior abdominal surgeries.  No urinary issues   Patient does not have a primary care physician.  Otherwise very healthy and active.  He does recall one episode of rectal pain in the past.  [place] to an urgent care center in 2019.  Given suppositories & it gradually resolved     Medical History: History reviewed. No pertinent past medical history.  There is no problem list on file for this patient.   History reviewed. No pertinent surgical history.   No Known Allergies  No current outpatient medications on file prior to visit.   No current facility-administered medications on file prior to visit.    Family History  Family history unknown: Yes     Social History   Tobacco Use  Smoking Status Never Smoker  Smokeless Tobacco Never Used      Social History   Socioeconomic History  . Marital status: Single  Tobacco Use  . Smoking status: Never Smoker  . Smokeless tobacco: Never Used  Substance and Sexual Activity  . Alcohol use: Yes    Comment: Not much  . Drug use: Never    ############################################################   Review of Systems: A complete review of systems (ROS) was obtained from the patient.  I have reviewed this information and discussed as appropriate with the patient.  See HPI as well for other pertinent ROS.  Constitutional:  No fevers, chills, sweats.  Weight stable Eyes:  No vision changes, No discharge HENT:  No sore throats, nasal drainage Lymph: No neck swelling, No bruising easily Pulmonary:  No cough, productive sputum CV: No orthopnea, PND  Patient walks 5 miles without difficulty.  No exertional chest/neck/shoulder/arm pain.  GI:  No personal nor family history of GI/colon cancer, inflammatory bowel disease, irritable bowel syndrome, allergy such as Celiac Sprue, dietary/dairy problems, colitis, ulcers nor gastritis.  No recent sick contacts/gastroenteritis.  No travel outside the country.  No changes in diet.  Renal: No UTIs, No hematuria Genital:  No drainage, bleeding, masses Musculoskeletal: No severe joint pain.  Good ROM major joints Skin:  No sores or lesions Heme/Lymph:  No easy bleeding.  No swollen lymph nodes  Objective:   Vitals:  06/10/21 1613  BP: 126/80  Pulse: 73  Temp: (!) 38.3 C (101 F)  SpO2: 98%  Weight: 83.4 kg (183 lb 12.8 oz)  Height: 175.3 cm (5' 9)     Body mass index is 27.14 kg/m.  PHYSICAL EXAM:   Constitutional: Not cachectic.  Hygeine adequate.  Vitals signs as above.   Eyes: Pupils reactive, normal extraocular movements. Sclera nonicteric Neuro: CN II-XII intact.  No major focal sensory defects.  No major motor deficits. Lymph: No head/neck/groin lymphadenopathy Psych:  No severe agitation.  No severe anxiety.  Judgment  & insight Adequate, Oriented x4, HENT: Normocephalic, Mucus membranes moist.  No thrush.   Neck: Supple, No tracheal deviation.  No obvious thyromegaly Chest: No pain to chest wall compression.  Good respiratory excursion.  No audible wheezing CV:  Pulses intact.   Regular rhythm.  No major extremity edema  Abdomen:  Flat Hernia: Not present. Diastasis recti: Not present. Soft.   Nondistended.  Nontender.  No hepatomegaly.  No splenomegaly  Gen:  Inguinal hernia: Not present.  Inguinal lymph nodes: without lymphadenopathy.    Rectal:   ##################################  Patient examined in decubitus position.  Perianal skin Clean with good hygiene  Pruritis ani: Not present  Anal fissure: Increased sphincter tone with sharp pain especially posterior anal canal.  Fissure suspected.   Perirectal abscess/fistula Not present External hemorrhoids Mild thickening in anterior midline raphae but no sentinel tag Pilonidal disease: Not present Condyloma / warts: Not present  Digital and anoscopic rectal exam Not tolerated   Sphincter tone Increased    ###################################   Ext: No obvious deformity or contracture.  Edema: Not present.  No cyanosis Skin: No major subcutaneous nodules.  Warm and dry Musculoskeletal: Severe joint rigidity not present.  No obvious clubbing.  No digital petechiae.      Labs, Imaging and Diagnostic Testing:  Not applicable  PRIOR NOTES    Located in 'Care Everywhere' section of Epic EMR chart  SURGERY NOTES:  Not applicable  PATHOLOGY:  Not applicable   Assessment and Plan:  DIAGNOSES:  Diagnoses and all orders for this visit:  Acute anal fissure  Anal pain     ASSESSMENT/PLAN  Episode of severe sharp anal pain with increased anal sphincter tone rather classic for an anal fissure.  No good evidence of abscess, hemorrhoid, or other possible etiologies.  The anatomy & physiology of the anorectal region was  discussed.  The pathophysiology of anal fissure and differential diagnosis was discussed.  Natural history progression  was discussed.   I stressed the importance of a bowel regimen to have daily soft bowel movements to minimize progression of disease.   I discussed the use of warm soaks &  muscle relaxant, diltiazem, to help the anal sphincter relax, allow the spasming to stop, and help the tear/fissure to heal.  He is never had fissure before that he knows of and is otherwise very healthy without any history of inflammatory bowel disease or other gastrointestinal disorders.  Try and let this heal without surgery first  If non-operative treatment does not heal the fissure, I would recommend examination under anesthesia for better examination to confirm the diagnosis and treat by lateral internal sphincterotomy to allow the fissure to heal.  Technique, benefits, alternatives discussed.  Risks such as bleeding, pain, incontinence, recurrence, heart attack, death, and other risks were discussed.    Educational handouts further explaining the pathology, treatment options, and bowel regimen were given as well.    The  patient expressed understanding.  I asked him to call us  in 2 weeks for an update.  Hopefully soaks, anti-inflammatories, and topical muscle relaxant diltiazem will give a 75% chance for this to heal.  If the pain does not resolve in a few weeks or worsens, he should proceed with anorectal examination under anesthesia with probable partial internal sphincterotomy.  Rule out other sources of anal pain.    FOLLOWUP:  Return for Call with update on your progess in 2 weeks.  I had direct face-to-face contact with the patient for a total of 25 minutes and greater than 50% of that time was spent providing counseling and/or coordination of care for the patient regarding the above.     ########################################################   Elspeth KYM Schultze, MD, FACS, MASCRS Esophageal,  Gastrointestinal & Colorectal Surgery Robotic and Minimally Invasive Surgery  [facility], [facility]  [facility]  [address], [address] [place], [place] [address] [phone] Fax [phone] Main               Note: Portions of this report may have been transcribed using voice recognition software. Every effort was made to ensure accuracy; however, inadvertent computerized transcription errors may be present.   Any transcriptional errors that result from this process are unintentional.

## 2021-10-13 NOTE — Progress Notes (Addendum)
 Subjective:  Patient ID: Paul Reaume is a 51 y.o. male.  HPI  51 year old male with no PMH presents to clinic for COVID-19 TESTING due to testing positive on 2 at home test this past week. Pt is COVID-19 vaccinated with 2 doses of Pfizer. Pt states he started feeling sick this past Saturday but not sick enough to make him think he had COVID-19, pt notes he has had it before and felt much sicker then. Pt states he went to a [employer] party but took 2 at home covid-19 tests yesterday, both of which came back positive. Pt still did not believe he had COVID-19 and presented to clinic today for testing and official documentation of a positive test if he was truly positive. [facility].    Body Aches Patient presents with: body aches and fatigue   Presenting symptoms: diarrhea, fatigue, myalgias and nausea   Presenting symptoms: no cough, no fever, no headaches, no rhinorrhea, no shortness of breath, no sore throat and no vomiting   Severity:  Moderate Onset quality:  Sudden Duration of current symptoms:  3 days Timing:  Gradually improving Chronicity:  New Treatments tried:  Rest Worsened by:  Eating Associated symptoms: decreased appetite and nasal congestion   Associated symptoms: no abdominal pain, no arthralgias, no chest pain, no chills, no dizziness, no ear pain, no neck stiffness and no lethargy   Risk factors comment:  None Recent travel:  None        Review of Systems  Constitutional: Positive for decreased appetite and fatigue. Negative for activity change, appetite change, chills, diaphoresis, fever and unexpected weight change.  HENT: Positive for congestion, postnasal drip and rhinorrhea. Negative for dental problem, drooling, ear discharge, ear pain, facial swelling, hearing loss, mouth sores, nosebleeds, sinus pressure, sinus pain, sneezing, sore throat, tinnitus, trouble swallowing and voice change.   Eyes: Negative for pain and itching.   Respiratory: Negative for apnea, cough, choking, chest tightness, shortness of breath, wheezing and stridor.   Cardiovascular: Negative for chest pain, palpitations and leg swelling.  Gastrointestinal: Positive for diarrhea and nausea. Negative for abdominal distention, abdominal pain, anal bleeding, blood in stool, constipation, rectal pain and vomiting.  Musculoskeletal: Positive for myalgias. Negative for arthralgias, back pain, gait problem, joint swelling, neck pain and neck stiffness.  Skin: Negative for color change, pallor, rash and wound.  Allergic/Immunologic: Negative for environmental allergies, food allergies and immunocompromised state.  Neurological: Negative for dizziness, seizures, facial asymmetry, light-headedness, numbness and headaches.    Social History   Tobacco Use  Smoking Status Never  . Passive exposure: Never  Smokeless Tobacco Never   History reviewed. No pertinent past medical history. History reviewed. No pertinent surgical history. History reviewed. No pertinent family history. Objective:      Physical Exam Vitals and nursing note reviewed.  Constitutional:      General: He is awake. He is not in acute distress.    Appearance: Normal appearance. He is well-developed and well-groomed. He is not ill-appearing, toxic-appearing or diaphoretic.  HENT:     Head: Normocephalic and atraumatic.     Nose: No rhinorrhea.  Cardiovascular:     Rate and Rhythm: Normal rate and regular rhythm.  No extrasystoles are present.    Chest Wall: PMI is not displaced. No thrill.     Pulses: Normal pulses.     Heart sounds: Normal heart sounds, S1 normal and S2 normal. Heart sounds not distant. No murmur heard.  No friction rub. No gallop. No S3 or S4 sounds.  Pulmonary:     Effort: Pulmonary effort is normal. No tachypnea or respiratory distress.     Breath sounds: Normal breath sounds and air entry. No stridor. No wheezing, rhonchi or rales.     Comments: Normal  respiratory rate and effort, no use of accessory muscles noted, symmetrical chest expansion, ascultation clear in all lung fields, no wheezes, rales, crackles, rhonchi or stridor heard Chest:     Chest wall: No tenderness.  Musculoskeletal:     Right lower leg: No edema.     Left lower leg: No edema.  Lymphadenopathy:     Head:     Right side of head: No submental, submandibular, tonsillar, preauricular, posterior auricular or occipital adenopathy.     Left side of head: No submental, submandibular, tonsillar, preauricular, posterior auricular or occipital adenopathy.     Cervical: No cervical adenopathy.     Right cervical: No superficial, deep or posterior cervical adenopathy.    Left cervical: No superficial, deep or posterior cervical adenopathy.     Upper Body:     Right upper body: No supraclavicular adenopathy.     Left upper body: No supraclavicular adenopathy.  Skin:    General: Skin is warm and dry.     Capillary Refill: Capillary refill takes less than 2 seconds.     Coloration: Skin is not ashen, cyanotic, jaundiced, mottled, pale or sallow.     Findings: No rash.  Neurological:     General: No focal deficit present.     Mental Status: He is alert, oriented to person, place, and time and easily aroused. Mental status is at baseline.  Psychiatric:        Attention and Perception: Attention and perception normal.        Mood and Affect: Mood and affect normal.        Speech: Speech normal.        Behavior: Behavior normal. Behavior is cooperative.        Thought Content: Thought content normal.        Judgment: Judgment normal.       Assessment/Plan:   COVID-19 POSITIVE  COVID Oral Antiviral Eligibility Status: Does not meet eligibility: Due to no PMH    Based on today's visit:history, physical exam and all relevant testing completed in clinic today patient's visit diagnosis is/includes  1. Contact with and (suspected) exposure to covid-19   2. 2019 novel  coronavirus detected    Patient does not have a history of chronic conditions identified today  Treatment plan includes:  Orders Placed: Orders Placed This Encounter  Procedures  . LumiraDX SARS-COV-2 Rapid Result Antigen Test   Medications ordered this visit    No prescriptions requested or ordered in this encounter    Current medication list and any new medications prescribed or recommended today were reviewed with the patient and specific instructions were provided Yes     Provider Recommendations   - Stay well hydrated and well rested - Use OTC analgesic for body aches and fever  - Use saline nasal spray as needed for nasal congestion - Use OTC cold and flu medication as needed for cough and sinus congestion  - Red flag symptoms identified and discussed, pt expressed clear understanding - If symptoms worsen or do not improve with treatment plan seek higher level of care  - Pt education on the CDC's COVID-19 quarantine and isolation recommendations provided   Follow up care  instructions were provided to the Patient. All questions answered. Patient verbalized understanding of plan of care today.   HPI Section HPI provided by Patient                                           Addis Tuohy does not currently have medications on file. [id number] Screening Questionnaire Completed State Department of Health notified of results per regulations

## 2021-12-21 ENCOUNTER — Ambulatory Visit (HOSPITAL_COMMUNITY)
Admission: RE | Admit: 2021-12-21 | Discharge: 2021-12-21 | Disposition: A | Discharge status: Home / Self Care | Payer: 59 | Source: Ambulatory Visit | Attending: Emergency Medicine | Admitting: Emergency Medicine

## 2021-12-21 ENCOUNTER — Encounter (HOSPITAL_COMMUNITY): Payer: Self-pay

## 2021-12-21 VITALS — BP 119/65 | HR 61 | Temp 99.2°F | Resp 18

## 2021-12-21 DIAGNOSIS — B360 Pityriasis versicolor: Secondary | ICD-10-CM | POA: Diagnosis not present

## 2021-12-21 MED ORDER — CICLOPIROX OLAMINE 0.77 % EX CREA: TOPICAL_CREAM | Freq: Two times a day (BID) | CUTANEOUS | 0 refills | Status: DC

## 2021-12-21 MED ORDER — FLUCONAZOLE 150 MG PO TABS: 150.0000 mg | ORAL_TABLET | Freq: Every day | ORAL | 0 refills | Status: AC

## 2021-12-21 NOTE — Discharge Instructions (Addendum)
Today you are being treated for fungus of the skin, there is more information inside of your packet about this type of skin infection, read at your leisure ? ?Take 1 Diflucan pill whenever you receive the medication and then in 3 days take a second dose ? ?After completion of oral medicine begin use of Loprox cream twice daily until symptoms have cleared, once rash has cleared use medication for an additional 3 days before discontinuing ? ?May follow-up at the urgent care as needed for persisting or reoccurring rash ?

## 2021-12-21 NOTE — ED Provider Notes (Signed)
?[facility] ? ? ? ?CSN: [id number] ?Arrival date & time: 12/21/21  1707 ? ? ?  ? ?History   ?Chief Complaint ?No chief complaint on file. ? ? ?HPI ?Paul Hancock is a 51 y.o. male.  ? ?Patient presents with rash to the bilateral arms and abdomen for 1 to 2 weeks.  Has not attempted treatment.  Rash is not pruritic more painful, denies drainage.  Denies changes in soaps, lotions, detergents, dietary changes or recent travel.  Denies use of any new medications or daily medications. 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  ? ?History reviewed. No pertinent past medical history. ? ?There are no problems to display for this patient. ? ? ?History reviewed. No pertinent surgical history. ? ? ? ? ?Home Medications   ? ?Prior to Admission medications   ?Medication Sig Start Date End Date Taking? Authorizing Provider  ?ciclopirox (LOPROX) 0.77 % cream Apply topically 2 (two) times daily. 12/21/21  Yes Markeda Narvaez, Elita Boone, NP  ?fluconazole (DIFLUCAN) 150 MG tablet Take 1 tablet (150 mg total) by mouth daily for 2 doses. 12/21/21 12/23/21 Yes Harmonee Tozer, Elita Boone, NP  ?naproxen (NAPROSYN) 500 MG tablet Take 1 tablet (500 mg total) by mouth 2 (two) times daily with a meal. 02/16/21   Wallis Bamberg, PA-C  ?tamsulosin (FLOMAX) 0.4 MG CAPS capsule Take 1 capsule (0.4 mg total) by mouth daily after supper. 02/16/21   Wallis Bamberg, PA-C  ? ? ?Family History ?Family History  ?Problem Relation Age of Onset  ? Cancer Neg Hx   ? Diabetes Neg Hx   ? Hyperlipidemia Neg Hx   ? Heart failure Neg Hx   ? ? ?Social History ?Social History  ? ?Tobacco Use  ? Smoking status: Never  ? Smokeless tobacco: Never  ?Substance Use Topics  ? Alcohol use: Yes  ? Drug use: Yes  ?  Types: Marijuana  ? ? ? ?Allergies   ?Patient has no known allergies. ? ? ?Review of Systems ?Review of Systems  ?Constitutional: Negative.   ?Respiratory: Negative.    ?Cardiovascular: Negative.   ?Skin:  Positive for rash. Negative for color change, pallor and wound.   ?Neurological: Negative.   ? ? ?Physical Exam ?Triage Vital Signs ?ED Triage Vitals  ?Enc Vitals Group  ?   BP 12/21/21 1737 119/65  ?   Pulse Rate 12/21/21 1737 61  ?   Resp 12/21/21 1737 18  ?   Temp 12/21/21 1737 99.2 ?F (37.3 ?C)  ?   Temp Source 12/21/21 1737 Oral  ?   SpO2 12/21/21 1737 100 %  ?   Weight --   ?   Height --   ?   Head Circumference --   ?   Peak Flow --   ?   Pain Score 12/21/21 1759 0  ?   Pain Loc --   ?   Pain Edu? --   ?   Excl. in GC? --   ? ?No data found. ? ?Updated Vital Signs ?BP 119/65 (BP Location: Left Arm)   Pulse 61   Temp 99.2 ?F (37.3 ?C) (Oral)   Resp 18   SpO2 100%  ? ?Visual Acuity ?Right Eye Distance:   ?Left Eye Distance:   ?Bilateral Distance:   ? ?Right Eye Near:   ?Left Eye Near:    ?Bilateral Near:    ? ?Physical Exam ?Constitutional:   ?   Appearance: Normal appearance.  ?HENT:  ?   Head: Normocephalic.  ?  Eyes:  ?   Extraocular Movements: Extraocular movements intact.  ?Pulmonary:  ?   Effort: Pulmonary effort is normal.  ?Skin: ?   Comments: Light macular patches present on the anterior of the bilateral arms and across the abdomen  ?Neurological:  ?   Mental Status: He is alert and oriented to person, place, and time. Mental status is at baseline.  ?Psychiatric:     ?   Mood and Affect: Mood normal.     ?   Behavior: Behavior normal.  ? ? ? ?UC Treatments / Results  ?Labs ?(all labs ordered are listed, but only abnormal results are displayed) ?Labs Reviewed - No data to display ? ?EKG ? ? ?Radiology ?No results found. ? ?Procedures ?Procedures (including critical care time) ? ?Medications Ordered in UC ?Medications - No data to display ? ?Initial Impression / Assessment and Plan / UC Course  ?I have reviewed the triage vital signs and the nursing notes. ? ?Pertinent labs & imaging results that were available during my care of the patient were reviewed by me and considered in my medical decision making (see chart for details). ? ?Tinea versicolor ? ?Prescribe  Diflucan 150 mg once then second dose in 2 days then use of Loprox cream twice daily until 3 days past resolution of rash, given written handout about etiology and preventative measures for fungal infections, may follow-up with urgent care as needed for persistent or reoccurring symptoms ?Final Clinical Impressions(s) / UC Diagnoses  ? ?Final diagnoses:  ?Tinea versicolor  ? ? ? ?Discharge Instructions   ? ?  ?Today you are being treated for fungus of the skin, there is more information inside of your packet about this type of skin infection, read at your leisure ? ?Take 1 Diflucan pill whenever you receive the medication and then in 3 days take a second dose ? ?After completion of oral medicine begin use of Loprox cream twice daily until symptoms have cleared, once rash has cleared use medication for an additional 3 days before discontinuing ? ?May follow-up at the urgent care as needed for persisting or reoccurring rash ? ? ?ED Prescriptions   ? ? Medication Sig Dispense Auth. Provider  ? fluconazole (DIFLUCAN) 150 MG tablet Take 1 tablet (150 mg total) by mouth daily for 2 doses. 2 tablet Salli Quarry R, NP  ? ciclopirox (LOPROX) 0.77 % cream Apply topically 2 (two) times daily. 15 g Valinda Hoar, NP  ? ?  ? ?PDMP not reviewed this encounter. ?  ?Valinda Hoar, NP ?12/21/21 1810 ? ?

## 2021-12-21 NOTE — ED Triage Notes (Signed)
C/o rash all over body x 1 week. ?

## 2022-01-28 ENCOUNTER — Other Ambulatory Visit: Payer: Self-pay | Admitting: Gastroenterology

## 2022-01-28 DIAGNOSIS — Q429 Congenital absence, atresia and stenosis of large intestine, part unspecified: Secondary | ICD-10-CM

## 2022-01-28 DIAGNOSIS — R1084 Generalized abdominal pain: Secondary | ICD-10-CM

## 2022-02-01 ENCOUNTER — Ambulatory Visit
Admission: RE | Admit: 2022-02-01 | Discharge: 2022-02-01 | Disposition: A | Discharge status: Home / Self Care | Payer: 59 | Source: Ambulatory Visit | Attending: Gastroenterology | Admitting: Gastroenterology

## 2022-02-01 DIAGNOSIS — Q429 Congenital absence, atresia and stenosis of large intestine, part unspecified: Secondary | ICD-10-CM

## 2022-02-01 DIAGNOSIS — R1084 Generalized abdominal pain: Secondary | ICD-10-CM

## 2022-02-01 MED ORDER — IOPAMIDOL (ISOVUE-300) INJECTION 61%
100.0000 mL | Freq: Once | INTRAVENOUS | Status: AC | PRN
Start: 2022-02-01 — End: 2022-02-01
  Administered 2022-02-01: 100 mL via INTRAVENOUS

## 2022-02-03 ENCOUNTER — Other Ambulatory Visit: Payer: Self-pay | Admitting: Gastroenterology

## 2022-02-03 DIAGNOSIS — Z1211 Encounter for screening for malignant neoplasm of colon: Secondary | ICD-10-CM

## 2022-02-17 NOTE — Progress Notes (Signed)
 REFERRING PHYSICIAN:  Dianna Jerrell BROCKS, MD  PROVIDER:  LONNI OZELL PIZZA, MD  MRN: [id number] DOB: Jan 07, 1971 DATE OF ENCOUNTER: 02/17/2022  Subjective   Chief Complaint: Hemorrhoids   History of Present Illness: Paul Hancock is a 51 y.o. male with history of BPH, whom is seen in the office today as a referral by Dr. Dianna for evaluation of hemorrhoids  He was previously seen in October in our urgent office with Dr. Sheldon with sharp anal pain.  He described this as the sensation of passing a reasonably good bowel movements.  He had noted a increased sphincter tone on exam with sharp pain in the posterior anal canal, suspected to have underlying fissure.  He was given topical diltiazem with instructions to apply.  He was sent with limited records from [facility].  Dr. Jeronimo notes indicate that he was planning a colonoscopy 01/28/2022 at their endoscopy center.  We have not received any reports of his colonoscopy.  The patient tells us  that he was told his colonoscopy was normal but they were unable to advance the scope all the way through his colon and so a CT colonography was planned.  CT abdomen/pelvis 02/02/2022 for colonic stenosis was read as negative.  No evidence of obstruction malignancy or other significant abnormality.  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  He reports a history of some degree of constipation in the past with hard stools but has been working to have a higher fiber diet.  He has on average 2 bowel movements a day and they are variable in consistency.  He is not taking a fiber supplement.  He drinks less than 64 ounces of water per day.  Spends approximately 5 to 10 minutes on the commode.  He does describe some pain with defecation and a tearing sensation in the anal area.  He denies any abdominal pain or cramps.  He has previously tried topical diltiazem for a  fissure  PMH: BPH  PSH: No prior anorectal surgeries or procedures  FHx: Maternal aunt with breast cancer, otherwise denies any known family history of colorectal, breast, endometrial or ovarian cancer  Social Hx: Denies use of tobacco/illicit drug.  Social EtOH use.  He works for the city of [employer]  He is here today with his wife.   Review of Systems: A complete review of systems was obtained from the patient.  I have reviewed this information and discussed as appropriate with the patient.  See HPI as well for other ROS.  All of the below systems have been reviewed with the patient and positives are indicated with bold text General: chills, fever or night sweats Eyes: blurry vision or double vision ENT: epistaxis or sore throat Allergy/Immunology: itchy/watery eyes or nasal congestion Hematologic/Lymphatic: bleeding problems, blood clots or swollen lymph nodes Endocrine: temperature intolerance or unexpected weight changes Breast: new or changing breast lumps or nipple discharge Resp: cough, shortness of breath, or wheezing CV: chest pain or dyspnea on exertion GI: as per HPI GU: dysuria, trouble voiding, or hematuria MSK: joint pain or joint stiffness Neuro: TIA or stroke symptoms Derm: pruritus and skin lesion changes Psych: anxiety and depression   Medical History: History reviewed. No pertinent past medical history.  There is no problem list on file for this patient.   History reviewed. No pertinent surgical history.   No Known Allergies  No current outpatient  medications on file prior to visit.   No current facility-administered medications on file prior to visit.    Family History  Family history unknown: Yes     Social History   Tobacco Use  Smoking Status Never  Smokeless Tobacco Never     Social History   Socioeconomic History  . Marital status: Single  Tobacco Use  . Smoking status: Never  . Smokeless tobacco: Never   Substance and Sexual Activity  . Alcohol use: Yes    Comment: Not much  . Drug use: Never    Objective:    Vitals:   02/17/22 1413  BP: 122/78  Pulse: 74  Temp: 36.6 C (97.9 F)  SpO2: 98%  Weight: 79.4 kg (175 lb)  Height: 175.3 cm (5' 9)    Body mass index is 25.84 kg/m.  Constitutional: NAD; conversant; no deformities Eyes: Moist conjunctiva; no lid lag; anicteric Neck: Trachea midline; no thyromegaly Lungs: Normal respiratory effort; no tactile fremitus CV: RRR Anorectal: Normal perianal skin.  DRE-limited by spasticity of pelvic floor musculature.  With gentle opening of the perianal skin, I do not see any obvious openings or fissures at this juncture.  He does note that the posterior midline is the area where in the past is typically painful. MSK: Normal range of motion of extremities Psychiatric: Appropriate affect; alert and oriented x3  A chaperone, Wyn Essex CMA, was present for this encounter and examination   Labs, Imaging and Diagnostic Testing: I have personally reviewed the relevant notes from Dr. Dianna, no colonoscopy to see at this time; CT A/P noted above  Assessment and Plan:  Diagnoses and all orders for this visit:  History of anal fissures     Paul Hancock is a very pleasant 51 y.o. male with hx of  here for evaluation of anal fissures  -Currently it appears as has healed.  I do not see any obvious opened fissure at this time.  There is some scarring in the posterior midline which would go along with where he is noted pain in the past.  We discussed options going forward.  I recommended he begin taking consistently a fiber supplement daily such as Benefiber.  We discussed drinking 64 ounces of water per day and minimizing commode times to 2 to 3 minutes.  We discussed not everting the buttocks when sitting on the commode to avoid placing excess tension on the anoderm. -We will go ahead and give him prescription for topical  nifedipine to be applied 4 times daily for the next 4 to 6 weeks as well. -If his symptoms persist or fail to resolve, we have asked that he please return for follow-up  Return if symptoms worsen or fail to improve.  I spent a total of 40 minutes in both face-to-face and non-face-to-face activities, excluding procedures performed, for this visit on the date of this encounter.   Paul Pizza, MD [facility], [facility]

## 2022-03-29 ENCOUNTER — Ambulatory Visit
Admission: RE | Admit: 2022-03-29 | Discharge: 2022-03-29 | Disposition: A | Discharge status: Home / Self Care | Payer: 59 | Source: Ambulatory Visit | Attending: Gastroenterology | Admitting: Gastroenterology

## 2022-03-29 DIAGNOSIS — Z1211 Encounter for screening for malignant neoplasm of colon: Secondary | ICD-10-CM

## 2022-12-12 ENCOUNTER — Encounter (HOSPITAL_COMMUNITY): Payer: Self-pay

## 2022-12-12 ENCOUNTER — Ambulatory Visit (HOSPITAL_COMMUNITY)
Admission: RE | Admit: 2022-12-12 | Discharge: 2022-12-12 | Disposition: A | Discharge status: Home / Self Care | Payer: 59 | Source: Ambulatory Visit | Attending: Emergency Medicine | Admitting: Emergency Medicine

## 2022-12-12 VITALS — BP 106/65 | HR 87 | Temp 97.4°F | Resp 14

## 2022-12-12 DIAGNOSIS — B360 Pityriasis versicolor: Secondary | ICD-10-CM | POA: Diagnosis not present

## 2022-12-12 MED ORDER — CLOTRIMAZOLE 1 % EX CREA: TOPICAL_CREAM | CUTANEOUS | 0 refills | Status: DC

## 2022-12-12 NOTE — ED Provider Notes (Signed)
[facility]    CSN: [id number] Arrival date & time: 12/12/22  1258      History   Chief Complaint Chief Complaint  Patient presents with   appt 1   Rash    HPI Paul Hancock is a 52 y.o. male.   52 year old male pt, Paul Hancock, presents to urgent care for rash on body x 3 months. Pt seen ~ 1 year prior for same. No new lotions or soaps noted.   The history is provided by the patient. No language interpreter was used.    History reviewed. No pertinent past medical history.  Patient Active Problem List   Diagnosis Date Noted   Tinea versicolor 12/12/2022    History reviewed. No pertinent surgical history.     Home Medications    Prior to Admission medications   Medication Sig Start Date End Date Taking? Authorizing Provider  clotrimazole (LOTRIMIN) 1 % cream Apply to affected area 2 times daily for 2 weeks 12/12/22  Yes Russell Quinney, Para March, NP  naproxen (NAPROSYN) 500 MG tablet Take 1 tablet (500 mg total) by mouth 2 (two) times daily with a meal. 02/16/21   Wallis Bamberg, PA-C  tamsulosin (FLOMAX) 0.4 MG CAPS capsule Take 1 capsule (0.4 mg total) by mouth daily after supper. 02/16/21   Wallis Bamberg, PA-C    Family History Family History  Problem Relation Age of Onset   Cancer Neg Hx    Diabetes Neg Hx    Hyperlipidemia Neg Hx    Heart failure Neg Hx     Social History Social History   Tobacco Use   Smoking status: Never   Smokeless tobacco: Never  Substance Use Topics   Alcohol use: Yes   Drug use: Yes    Types: Marijuana     Allergies   Patient has no known allergies.   Review of Systems Review of Systems  Skin:  Positive for rash.  All other systems reviewed and are negative.    Physical Exam Triage Vital Signs ED Triage Vitals  Enc Vitals Group     BP 12/12/22 1311 106/65     Pulse Rate 12/12/22 1311 87     Resp 12/12/22 1311 14     Temp 12/12/22 1311 (!) 97.4 F (36.3 C)     Temp Source 12/12/22 1311 Oral      SpO2 12/12/22 1311 96 %     Weight --      Height --      Head Circumference --      Peak Flow --      Pain Score 12/12/22 1310 0     Pain Loc --      Pain Edu? --      Excl. in GC? --    No data found.  Updated Vital Signs BP 106/65 (BP Location: Left Arm)   Pulse 87   Temp (!) 97.4 F (36.3 C) (Oral)   Resp 14   SpO2 96%   Visual Acuity Right Eye Distance:   Left Eye Distance:   Bilateral Distance:    Right Eye Near:   Left Eye Near:    Bilateral Near:     Physical Exam Vitals and nursing note reviewed.  Skin:    General: Skin is warm.     Capillary Refill: Capillary refill takes less than 2 seconds.     Findings: Rash present. Rash is macular.     Comments: Macular scaly rash: oval-round on trunk and  upper extremities,paler patches noted  Neurological:     General: No focal deficit present.     Mental Status: He is oriented to person, place, and time.     GCS: GCS eye subscore is 4. GCS verbal subscore is 5. GCS motor subscore is 6.  Psychiatric:        Attention and Perception: Attention normal.        Mood and Affect: Mood normal.        Speech: Speech normal.        Behavior: Behavior normal.      UC Treatments / Results  Labs (all labs ordered are listed, but only abnormal results are displayed) Labs Reviewed - No data to display  EKG   Radiology No results found.  Procedures Procedures (including critical care time)  Medications Ordered in UC Medications - No data to display  Initial Impression / Assessment and Plan / UC Course  I have reviewed the triage vital signs and the nursing notes.  Pertinent labs & imaging results that were available during my care of the patient were reviewed by me and considered in my medical decision making (see chart for details).     Ddx: Tinea versicolor, pityriasis rosea,ringworm Final Clinical Impressions(s) / UC Diagnoses   Final diagnoses:  Tinea versicolor     Discharge Instructions       Please follow up with dermatology for long term management. Use cream 1 week after rash has disappeared.      ED Prescriptions     Medication Sig Dispense Auth. Provider   clotrimazole (LOTRIMIN) 1 % cream Apply to affected area 2 times daily for 2 weeks 28 g Halsey Hammen, Para March, NP      PDMP not reviewed this encounter.   Clancy Gourd, NP 12/12/22 1621

## 2022-12-12 NOTE — ED Triage Notes (Signed)
Pt reports has rash on body for 3 months. Denies itching.

## 2022-12-12 NOTE — Discharge Instructions (Addendum)
Please follow up with dermatology for long term management. Use cream 1 week after rash has disappeared.

## 2024-05-02 ENCOUNTER — Ambulatory Visit

## 2024-05-02 VITALS — BP 126/78 | HR 66 | Ht 69.0 in | Wt 179.8 lb

## 2024-05-02 DIAGNOSIS — L729 Follicular cyst of the skin and subcutaneous tissue, unspecified: Secondary | ICD-10-CM | POA: Diagnosis not present

## 2024-05-02 DIAGNOSIS — D2330 Other benign neoplasm of skin of unspecified part of face: Secondary | ICD-10-CM

## 2024-05-02 NOTE — Progress Notes (Addendum)
 NAME: Paul Hancock  MRN: [id number]  DOB: [birth year]    Referring physician:??Bakare, Mobolaji B, MD  PCP:?Bakare, Mobolaji B, MD    CHIEF COMPLAINT: Cyst on right face at the level of the angle of the mandible.   HPI:?  This is a 53 y.o. year old male with PMH and PSH as below who presents in consultation for cyst on right face at the level of the angle of the mandible.   It has been present for 2 years. No history of trauma/surgery/piercing in the area.  Is the keloid painful, itchy, or tender? Denies  Has the size changed over time? Has been growing    Previous treatments? Denies   Do any family members have a history of cysts or hypertrophic scars? Denies  Patient denies h/o smoking, HTN, coagulopathies, or thyroid conditions. Not on any anticoagulation.     Family History:   Family history is negative for bleeding/clotting disorders, problems with anesthesia, connective tissue disorders.     Social History:   Social History   Socioeconomic History   Marital status: Single    Spouse name: Not on file   Number of children: Not on file   Years of education: Not on file   Highest education level: Not on file  Occupational History   Not on file  Tobacco Use   Smoking status: Never   Smokeless tobacco: Never  Substance and Sexual Activity   Alcohol use: Yes   Drug use: Yes    Types: Marijuana   Sexual activity: Not on file  Other Topics Concern   Not on file  Social History Narrative   Not on file   Social Drivers of Health   Financial Resource Strain: Not on file  Food Insecurity: Not on file  Transportation Needs: Not on file  Physical Activity: Not on file  Stress: Not on file  Social Connections: Not on file    Smoker/Vape Denies   PMH:  No past medical history on file.    PSH:  No past surgical history on file.    MEDICATIONS:?   Current Outpatient Medications:    clotrimazole  (LOTRIMIN ) 1 % cream, Apply to affected area 2 times daily for  2 weeks, Disp: 28 g, Rfl: 0   naproxen  (NAPROSYN ) 500 MG tablet, Take 1 tablet (500 mg total) by mouth 2 (two) times daily with a meal., Disp: 30 tablet, Rfl: 0   tamsulosin  (FLOMAX ) 0.4 MG CAPS capsule, Take 1 capsule (0.4 mg total) by mouth daily after supper., Disp: 30 capsule, Rfl: 0    ALLERGIES:?  has no known allergies.    REVIEW OF SYSTEMS:  Review of Systems  ROS negative except as noted in HPI  VITALS:  Blood pressure 126/78, pulse 66, height 5' 9 (1.753 m), weight 179 lb 12.8 oz (81.6 kg), SpO2 95%.   BP 126/78 (BP Location: Left Arm, Patient Position: Sitting, Cuff Size: Large)   Pulse 66   Ht 5' 9 (1.753 m)   Wt 179 lb 12.8 oz (81.6 kg)   SpO2 95%   BMI 26.55 kg/m   Body mass index is 26.55 kg/m.  General: Well appearing, no apparent distress.  Neuro: A&Ox3 HEENT: Normocephalic, atraumatic, PERRL.  Cyst/s location & Size: 2 cm x 2 cm raised, well-defined not spreading beyond the original wound, no ulceration, infection, or secondary changes. Not fixed to underlying structures, mobile.    ASSESSMENT/PLAN  Assessment & Plan   Today we discussed the risks, benefits and alternatives  to excision of cyst on right face at the level of the angle of the mandible. We discussed the alternatives which include continued observation; however, I told the patient that I do not believe this mass will resolve on its own. We then discussed the benefits of surgical excision which include complete removal of the lesion. We discussed the risks of excision which include seroma, hematoma, infection, bleeding, damage to surrounding healthy tissue and structures (including facial nerve with possible sequela) and need for further surgery. We also discussed the risks of hair loss, wound separation and recurrence of the mass. We discussed scar patterns of tissue rearrangement, if needed.  I explained that the mass will be sent to pathology and if it were to be malignant further surgery may be needed.  We discussed the risks of anesthesia which will be further elaborated on by our anesthesia colleagues. The patient has a good understanding of all the risks and benefits, postoperative course and care. We obtained pictures. All questions were answered.    The plan agreed upon includes: Excision of cyst on right face at the level of the angle of the mandible in OR.  I spent 30 minutes counseling the patient and discussing plan of care.  Brittany Amirault M Jakaylee Sasaki [facility]

## 2024-05-18 ENCOUNTER — Ambulatory Visit

## 2024-05-18 VITALS — BP 117/73 | HR 88 | Ht 69.0 in | Wt 182.0 lb

## 2024-05-18 DIAGNOSIS — D2330 Other benign neoplasm of skin of unspecified part of face: Secondary | ICD-10-CM

## 2024-05-18 NOTE — Progress Notes (Signed)
 Patient ID: Paul Hancock, male    DOB: 1971/04/30, 53 y.o.   MRN: [id number]  Chief Complaint  Patient presents with   pre op    No diagnosis found.   History of Present Illness: Paul Hancock is a 53 y.o.  male  with a history of  right face cyst at the level of the angle of the mandible.  He presents for preoperative evaluation for upcoming procedure, excision  right face cyst at the level of the angle of the mandible, scheduled for 10/06 with Dr. Abubakr Wieman.  The patient has had problems with anesthesia.   Job: Works for a company  PMH Significant for:  No past medical history on file.  Past Medical History: Allergies: No Known Allergies  Current Medications:  Current Outpatient Medications:    doxycycline (VIBRAMYCIN) 100 MG capsule, Take 100 mg by mouth 2 (two) times daily., Disp: , Rfl:    ibuprofen (ADVIL) 800 MG tablet, Take 800 mg by mouth every 6 (six) hours as needed., Disp: , Rfl:    clotrimazole  (LOTRIMIN ) 1 % cream, Apply to affected area 2 times daily for 2 weeks (Patient not taking: Reported on 05/18/2024), Disp: 28 g, Rfl: 0   naproxen  (NAPROSYN ) 500 MG tablet, Take 1 tablet (500 mg total) by mouth 2 (two) times daily with a meal. (Patient not taking: Reported on 05/18/2024), Disp: 30 tablet, Rfl: 0   tamsulosin  (FLOMAX ) 0.4 MG CAPS capsule, Take 1 capsule (0.4 mg total) by mouth daily after supper. (Patient not taking: Reported on 05/18/2024), Disp: 30 capsule, Rfl: 0  Past Medical Problems: No past medical history on file.  Past Surgical History: No past surgical history on file.  Social History: Social History   Socioeconomic History   Marital status: Single    Spouse name: Not on file   Number of children: Not on file   Years of education: Not on file   Highest education level: Not on file  Occupational History   Not on file  Tobacco Use   Smoking status: Never   Smokeless tobacco: Never  Substance and Sexual Activity   Alcohol  use: Yes   Drug use: Yes    Types: Marijuana   Sexual activity: Not on file  Other Topics Concern   Not on file  Social History Narrative   Not on file   Social Drivers of Health   Financial Resource Strain: Not on file  Food Insecurity: Not on file  Transportation Needs: Not on file  Physical Activity: Not on file  Stress: Not on file  Social Connections: Not on file  Intimate Partner Violence: Not on file    Family History: Family History  Problem Relation Age of Onset   Cancer Neg Hx    Diabetes Neg Hx    Hyperlipidemia Neg Hx    Heart failure Neg Hx     Review of Systems: ROS  Physical Exam: Vital Signs BP 117/73 (BP Location: Left Arm, Patient Position: Sitting, Cuff Size: Large)   Pulse 88   Ht 5' 9 (1.753 m)   Wt 182 lb (82.6 kg)   SpO2 96%   BMI 26.88 kg/m   Physical Exam  MA as chaperone Constitutional:      General: Not in acute distress.    Appearance: Normal appearance. Not ill-appearing.  HENT:     Head: Normocephalic and atraumatic. Cyst on right mandibular angle. Psychiatric:        Mood and Affect: Mood  normal.        Behavior: Behavior normal.    Assessment/Plan: The patient is scheduled for excision of right face cyst at the level of the angle of the mandible with Dr. Lugene Hitt.  Patient preference is to do it in the OR. Risks, benefits, and alternatives of procedure discussed, questions answered and consent obtained.    Smoking Status: Denies ; Counseling Given? N/A  Caprini Score: 3; Risk Factors include: Age, BMI > 25, and length of planned surgery. Recommendation for mechanical prophylaxis. Encourage early ambulation.   Picture consent obtained  Post-op Rx sent to pharmacy: Yes  Patient was provided with the General Surgical Risk consent document and Pain Medication Agreement prior to their appointment.  They had adequate time to read through the risk consent documents and Pain Medication Agreement. We also discussed them in  person together during this preop appointment. All of their questions were answered to their satisfaction.  Recommended calling if they have any further questions.  Risk consent form and Pain Medication Agreement to be scanned into patient's chart.  Electronically signed by: Daryl Beehler M Perl Kerney, MD 05/18/2024 2:17 PM

## 2024-05-29 ENCOUNTER — Encounter (HOSPITAL_BASED_OUTPATIENT_CLINIC_OR_DEPARTMENT_OTHER): Payer: Self-pay

## 2024-05-29 ENCOUNTER — Other Ambulatory Visit: Payer: Self-pay

## 2024-06-04 ENCOUNTER — Ambulatory Visit (HOSPITAL_BASED_OUTPATIENT_CLINIC_OR_DEPARTMENT_OTHER): Admission: RE | Admit: 2024-06-04 | Discharge: 2024-06-04 | Disposition: A | Discharge status: Home / Self Care

## 2024-06-04 ENCOUNTER — Encounter (HOSPITAL_BASED_OUTPATIENT_CLINIC_OR_DEPARTMENT_OTHER): Admission: RE | Disposition: A | Discharge status: Home / Self Care | Payer: Self-pay | Source: Home / Self Care

## 2024-06-04 ENCOUNTER — Ambulatory Visit (HOSPITAL_BASED_OUTPATIENT_CLINIC_OR_DEPARTMENT_OTHER): Admitting: Anesthesiology

## 2024-06-04 ENCOUNTER — Encounter (HOSPITAL_BASED_OUTPATIENT_CLINIC_OR_DEPARTMENT_OTHER): Payer: Self-pay

## 2024-06-04 ENCOUNTER — Other Ambulatory Visit: Payer: Self-pay

## 2024-06-04 DIAGNOSIS — R220 Localized swelling, mass and lump, head: Secondary | ICD-10-CM

## 2024-06-04 DIAGNOSIS — D2330 Other benign neoplasm of skin of unspecified part of face: Secondary | ICD-10-CM | POA: Diagnosis present

## 2024-06-04 DIAGNOSIS — Z01818 Encounter for other preprocedural examination: Secondary | ICD-10-CM

## 2024-06-04 DIAGNOSIS — L720 Epidermal cyst: Secondary | ICD-10-CM

## 2024-06-04 HISTORY — PX: EXCISION MASS HEAD: SHX6702

## 2024-06-04 SURGERY — EXCISION, MASS, HEAD
Anesthesia: General | Site: Face | Laterality: Right

## 2024-06-04 MED ORDER — FENTANYL CITRATE (PF) 100 MCG/2ML IJ SOLN
INTRAMUSCULAR | Status: DC | PRN
  Administered 2024-06-04: 50 ug via INTRAVENOUS

## 2024-06-04 MED ORDER — GABAPENTIN 300 MG PO CAPS: 300.0000 mg | ORAL_CAPSULE | Freq: Two times a day (BID) | ORAL | 0 refills | Status: AC

## 2024-06-04 MED ORDER — IBUPROFEN 800 MG PO TABS: 800.0000 mg | ORAL_TABLET | Freq: Four times a day (QID) | ORAL | 0 refills | Status: AC | PRN

## 2024-06-04 MED ORDER — BACITRACIN ZINC 500 UNIT/GM EX OINT
TOPICAL_OINTMENT | CUTANEOUS | Status: AC
  Filled 2024-06-04: qty 28.35

## 2024-06-04 MED ORDER — CHLORHEXIDINE GLUCONATE CLOTH 2 % EX PADS: 6.0000 | MEDICATED_PAD | Freq: Once | CUTANEOUS | Status: DC

## 2024-06-04 MED ORDER — FENTANYL CITRATE (PF) 100 MCG/2ML IJ SOLN: 25.0000 ug | INTRAMUSCULAR | Status: DC | PRN

## 2024-06-04 MED ORDER — LACTATED RINGERS IV SOLN: INTRAVENOUS | Status: DC

## 2024-06-04 MED ORDER — ACETAMINOPHEN 500 MG PO TABS
1000.0000 mg | ORAL_TABLET | ORAL | Status: AC
  Administered 2024-06-04: 1000 mg via ORAL

## 2024-06-04 MED ORDER — PROPOFOL 10 MG/ML IV BOLUS
INTRAVENOUS | Status: DC | PRN
  Administered 2024-06-04: 200 mg via INTRAVENOUS

## 2024-06-04 MED ORDER — OXYCODONE HCL 5 MG PO TABS: 5.0000 mg | ORAL_TABLET | Freq: Once | ORAL | Status: DC | PRN

## 2024-06-04 MED ORDER — MIDAZOLAM HCL 5 MG/5ML IJ SOLN
INTRAMUSCULAR | Status: DC | PRN
  Administered 2024-06-04: 2 mg via INTRAVENOUS

## 2024-06-04 MED ORDER — MIDAZOLAM HCL 2 MG/2ML IJ SOLN
INTRAMUSCULAR | Status: AC
  Filled 2024-06-04: qty 2

## 2024-06-04 MED ORDER — BACITRACIN 500 UNIT/GM EX OINT
TOPICAL_OINTMENT | CUTANEOUS | Status: DC | PRN
  Administered 2024-06-04: 1 via TOPICAL

## 2024-06-04 MED ORDER — DEXAMETHASONE SODIUM PHOSPHATE 10 MG/ML IJ SOLN
INTRAMUSCULAR | Status: DC | PRN
  Administered 2024-06-04: 10 mg via INTRAVENOUS

## 2024-06-04 MED ORDER — PROPOFOL 10 MG/ML IV BOLUS
INTRAVENOUS | Status: AC
Start: 2024-06-04 — End: 2024-06-04
  Filled 2024-06-04: qty 20

## 2024-06-04 MED ORDER — ACETAMINOPHEN 500 MG PO TABS
ORAL_TABLET | ORAL | Status: AC
  Filled 2024-06-04: qty 2

## 2024-06-04 MED ORDER — CEFAZOLIN SODIUM-DEXTROSE 2-4 GM/100ML-% IV SOLN
INTRAVENOUS | Status: AC
  Filled 2024-06-04: qty 100

## 2024-06-04 MED ORDER — VASHE WOUND IRRIGATION OPTIME
TOPICAL | Status: DC | PRN
Start: 2024-06-04 — End: 2024-06-04
  Administered 2024-06-04: 34 [oz_av]

## 2024-06-04 MED ORDER — LIDOCAINE 2% (20 MG/ML) 5 ML SYRINGE
INTRAMUSCULAR | Status: DC | PRN
  Administered 2024-06-04: 40 mg via INTRAVENOUS

## 2024-06-04 MED ORDER — ACETAMINOPHEN 500 MG PO TABS: 500.0000 mg | ORAL_TABLET | Freq: Four times a day (QID) | ORAL | 0 refills | Status: AC | PRN

## 2024-06-04 MED ORDER — GABAPENTIN 300 MG PO CAPS
300.0000 mg | ORAL_CAPSULE | ORAL | Status: AC
  Administered 2024-06-04: 300 mg via ORAL

## 2024-06-04 MED ORDER — DROPERIDOL 2.5 MG/ML IJ SOLN: 0.6250 mg | Freq: Once | INTRAMUSCULAR | Status: DC | PRN

## 2024-06-04 MED ORDER — GABAPENTIN 300 MG PO CAPS
ORAL_CAPSULE | ORAL | Status: AC
Start: 2024-06-04 — End: 2024-06-04
  Filled 2024-06-04: qty 1

## 2024-06-04 MED ORDER — ACETAMINOPHEN 10 MG/ML IV SOLN: 1000.0000 mg | Freq: Once | INTRAVENOUS | Status: DC | PRN

## 2024-06-04 MED ORDER — ONDANSETRON HCL 4 MG/2ML IJ SOLN
INTRAMUSCULAR | Status: DC | PRN
Start: 2024-06-04 — End: 2024-06-04
  Administered 2024-06-04: 4 mg via INTRAVENOUS

## 2024-06-04 MED ORDER — FENTANYL CITRATE (PF) 100 MCG/2ML IJ SOLN
INTRAMUSCULAR | Status: AC
  Filled 2024-06-04: qty 2

## 2024-06-04 MED ORDER — ONDANSETRON HCL 4 MG/2ML IJ SOLN
INTRAMUSCULAR | Status: AC
  Filled 2024-06-04: qty 2

## 2024-06-04 MED ORDER — CEFAZOLIN SODIUM-DEXTROSE 2-4 GM/100ML-% IV SOLN
2.0000 g | INTRAVENOUS | Status: AC
  Administered 2024-06-04: 2 g via INTRAVENOUS

## 2024-06-04 MED ORDER — OXYCODONE HCL 5 MG/5ML PO SOLN: 5.0000 mg | Freq: Once | ORAL | Status: DC | PRN

## 2024-06-04 MED ORDER — LIDOCAINE-EPINEPHRINE 1 %-1:100000 IJ SOLN
INTRAMUSCULAR | Status: DC | PRN
  Administered 2024-06-04: 10 mL

## 2024-06-04 SURGICAL SUPPLY — 70 items
BLADE CLIPPER SURG (BLADE) IMPLANT
BLADE SURG 10 STRL SS (BLADE) IMPLANT
BLADE SURG 15 STRL LF DISP TIS (BLADE) ×1 IMPLANT
BNDG ELASTIC 2INX 5YD STR LF (GAUZE/BANDAGES/DRESSINGS) IMPLANT
BNDG GAUZE DERMACEA FLUFF 4 (GAUZE/BANDAGES/DRESSINGS) IMPLANT
CANISTER SUCT 1200ML W/VALVE (MISCELLANEOUS) IMPLANT
CLEANSER WND VASHE 34 (WOUND CARE) IMPLANT
CLSR STERI-STRIP ANTIMIC 1/2X4 (GAUZE/BANDAGES/DRESSINGS) IMPLANT
CORD BIPOLAR FORCEPS 12FT (ELECTRODE) IMPLANT
COVER BACK TABLE 60X90IN (DRAPES) ×1 IMPLANT
COVER MAYO STAND STRL (DRAPES) ×1 IMPLANT
DERMABOND ADVANCED .7 DNX12 (GAUZE/BANDAGES/DRESSINGS) IMPLANT
DRAPE LAPAROTOMY T 102X78X121 (DRAPES) IMPLANT
DRAPE U-SHAPE 76X120 STRL (DRAPES) IMPLANT
DRSG ADAPTIC 3X8 NADH LF (GAUZE/BANDAGES/DRESSINGS) IMPLANT
DRSG EMULSION OIL 3X3 NADH (GAUZE/BANDAGES/DRESSINGS) IMPLANT
ELECT COATED BLADE 2.86 ST (ELECTRODE) IMPLANT
ELECT NDL BLADE 2-5/6 (NEEDLE) IMPLANT
ELECT NEEDLE BLADE 2-5/6 (NEEDLE) IMPLANT
ELECTRODE REM PT RETRN 9FT PED (ELECTROSURGICAL) IMPLANT
ELECTRODE REM PT RTRN 9FT ADLT (ELECTROSURGICAL) IMPLANT
GAUZE PAD ABD 8X10 STRL (GAUZE/BANDAGES/DRESSINGS) IMPLANT
GAUZE SPONGE 2X2 STRL 8-PLY (GAUZE/BANDAGES/DRESSINGS) IMPLANT
GAUZE SPONGE 4X4 12PLY STRL LF (GAUZE/BANDAGES/DRESSINGS) IMPLANT
GAUZE STRETCH 2X75IN STRL (MISCELLANEOUS) IMPLANT
GAUZE XEROFORM 1X8 LF (GAUZE/BANDAGES/DRESSINGS) IMPLANT
GAUZE XEROFORM 5X9 LF (GAUZE/BANDAGES/DRESSINGS) IMPLANT
GLOVE BIO SURGEON STRL SZ 6.5 (GLOVE) IMPLANT
GLOVE BIO SURGEON STRL SZ7.5 (GLOVE) IMPLANT
GLOVE BIO SURGEON STRL SZ8 (GLOVE) ×1 IMPLANT
GLOVE BIOGEL M STRL SZ7.5 (GLOVE) ×1 IMPLANT
GLOVE BIOGEL PI IND STRL 7.0 (GLOVE) IMPLANT
GLOVE BIOGEL PI IND STRL 7.5 (GLOVE) IMPLANT
GLOVE BIOGEL PI IND STRL 8 (GLOVE) IMPLANT
GOWN STRL REUS W/ TWL LRG LVL3 (GOWN DISPOSABLE) IMPLANT
GOWN STRL REUS W/ TWL XL LVL3 (GOWN DISPOSABLE) IMPLANT
GOWN STRL REUS W/TWL XL LVL3 (GOWN DISPOSABLE) ×1 IMPLANT
HIBICLENS CHG 4% 4OZ BTL (MISCELLANEOUS) ×1 IMPLANT
NDL HYPO 30GX1 BEV (NEEDLE) IMPLANT
NDL PRECISIONGLIDE 27X1.5 (NEEDLE) IMPLANT
NEEDLE HYPO 30GX1 BEV (NEEDLE) IMPLANT
NEEDLE PRECISIONGLIDE 27X1.5 (NEEDLE) ×1 IMPLANT
NS IRRIG 1000ML POUR BTL (IV SOLUTION) IMPLANT
PACK BASIN DAY SURGERY FS (CUSTOM PROCEDURE TRAY) ×1 IMPLANT
PACK UNIVERSAL I (CUSTOM PROCEDURE TRAY) IMPLANT
PENCIL SMOKE EVACUATOR (MISCELLANEOUS) IMPLANT
SHEET MEDIUM DRAPE 40X70 STRL (DRAPES) IMPLANT
SLEEVE SCD COMPRESS KNEE MED (STOCKING) IMPLANT
SOL PREP POV-IOD 4OZ 10% (MISCELLANEOUS) IMPLANT
STAPLER SKIN PROX WIDE 3.9 (STAPLE) IMPLANT
STRIP CLOSURE SKIN 1/2X4 (GAUZE/BANDAGES/DRESSINGS) IMPLANT
STRIP SUTURE WOUND CLOSURE 1/2 (MISCELLANEOUS) IMPLANT
SUCTION TUBE FRAZIER 10FR DISP (SUCTIONS) IMPLANT
SUT ETHILON 4 0 PS 2 18 (SUTURE) IMPLANT
SUT ETHILON 5 0 PS 2 18 (SUTURE) IMPLANT
SUT MNCRL AB 3-0 PS2 18 (SUTURE) IMPLANT
SUT MNCRL AB 4-0 PS2 18 (SUTURE) IMPLANT
SUT MON AB 5-0 P3 18 (SUTURE) IMPLANT
SUT NYLON ETHILON 5-0 P-3 1X18 (SUTURE) IMPLANT
SUT PDS II 3-0 CT2 27 ABS (SUTURE) IMPLANT
SUT PROLENE 5 0 P 3 (SUTURE) IMPLANT
SUT VIC AB 3-0 SH 27X BRD (SUTURE) IMPLANT
SUT VIC AB 4-0 PS2 18 (SUTURE) IMPLANT
SUT VICRYL RAPIDE 4-0 (SUTURE) IMPLANT
SYR BULB EAR ULCER 3OZ GRN STR (SYRINGE) IMPLANT
SYR BULB IRRIG 60ML STRL (SYRINGE) IMPLANT
SYR CONTROL 10ML LL (SYRINGE) ×1 IMPLANT
TOWEL GREEN STERILE FF (TOWEL DISPOSABLE) ×1 IMPLANT
TRAY DSU PREP LF (CUSTOM PROCEDURE TRAY) IMPLANT
TUBE CONNECTING 20X1/4 (TUBING) IMPLANT

## 2024-06-04 NOTE — Transfer of Care (Signed)
 Immediate Anesthesia Transfer of Care Note  Patient: Paul Hancock  Procedure(s) Performed: EXCISION, MASS, HEAD (Right: Face)  Patient Location: PACU  Anesthesia Type:General  Level of Consciousness: drowsy and patient cooperative  Airway & Oxygen Therapy: Patient Spontanous Breathing and Patient connected to face mask oxygen  Post-op Assessment: Report given to RN and Post -op Vital signs reviewed and stable  Post vital signs: Reviewed and stable  Last Vitals:  Vitals Value Taken Time  BP    Temp    Pulse 51 06/04/24 12:29  Resp    SpO2 97 % 06/04/24 12:29  Vitals shown include unfiled device data.  Last Pain:  Vitals:   06/04/24 1041  TempSrc: Temporal  PainSc: 0-No pain         Complications: No notable events documented.

## 2024-06-04 NOTE — Anesthesia Procedure Notes (Signed)
 Procedure Name: LMA Insertion Date/Time: 06/04/2024 11:50 AM  Performed by: Frost Kayla MATSU, CRNAPre-anesthesia Checklist: Patient identified, Emergency Drugs available, Suction available and Patient being monitored Patient Re-evaluated:Patient Re-evaluated prior to induction Oxygen Delivery Method: Circle system utilized Preoxygenation: Pre-oxygenation with 100% oxygen Induction Type: IV induction LMA: LMA inserted LMA Size: 4.0 Number of attempts: 1 Placement Confirmation: positive ETCO2 Tube secured with: Tape Dental Injury: Teeth and Oropharynx as per pre-operative assessment

## 2024-06-04 NOTE — H&P (Signed)
  Paul Hancock is a 53 y.o. male is here for a history and physical exam for surgery.  History of  right face cyst at the level of the angle of the mandible.  Here today for excision  right face cyst at the level of the angle of the mandible. No changes in history of physical exam from 10/06.  Medications:  Current Facility-Administered Medications:    ceFAZolin (ANCEF) IVPB 2g/100 mL premix, 2 g, Intravenous, On Call to OR, Kincaid Tiger M, MD   6 CHG cloth bath night before surgery, , , Once **AND** [START ON 06/05/2024] 6 CHG cloth bath AM of surgery, , , Once **AND** Chlorhexidine Gluconate Cloth 2 % PADS 6 each, 6 each, Topical, Once **AND** Chlorhexidine Gluconate Cloth 2 % PADS 6 each, 6 each, Topical, Once, Nikolai Wilczak M, MD   lactated ringers infusion, , Intravenous, Continuous, Jerrye Sharper, MD, Last Rate: 10 mL/hr at 06/04/24 1045, New Bag at 06/04/24 1045  Allergies: No Known Allergies  Past Medical History: History reviewed. No pertinent past medical history.  Past Surgical History:  Past Surgical History:  Procedure Laterality Date   COLONOSCOPY     MOUTH SURGERY      Social History:  Social History   Socioeconomic History   Marital status: Single    Spouse name: Not on file   Number of children: Not on file   Years of education: Not on file   Highest education level: Not on file  Occupational History   Not on file  Tobacco Use   Smoking status: Never   Smokeless tobacco: Never  Substance and Sexual Activity   Alcohol use: Yes    Comment: occais   Drug use: Yes    Types: Marijuana    Comment: not any more   Sexual activity: Yes  Other Topics Concern   Not on file  Social History Narrative   Not on file   Social Drivers of Health   Financial Resource Strain: Not on file  Food Insecurity: Not on file  Transportation Needs: Not on file  Physical Activity: Not on file  Stress: Not on file  Social Connections: Not on file   Intimate Partner Violence: Not on file    Family History  Problem Relation Age of Onset   Cancer Neg Hx    Diabetes Neg Hx    Hyperlipidemia Neg Hx    Heart failure Neg Hx    Risks, benefits, and alternatives of procedure discussed, questions answered and consent obtained. Ok to proceed to OR.  Shermika Balthaser M. Kemontae Dunklee, MD [facility]

## 2024-06-04 NOTE — Op Note (Addendum)
 Operative Note   DATE OF OPERATION: 06/04/2024  LOCATION: [facility]  SURGICAL DEPARTMENT: Plastic Surgery  PREOPERATIVE DIAGNOSES: Right facial mass  POSTOPERATIVE DIAGNOSES:  same  PROCEDURE: Excision of right facial mass (2 cm x 1 cm)  SURGEON: Nieves Client, MD  ASSISTANT: Estefana Camara, PA  ANESTHESIA:  General.   COMPLICATIONS: None.   INDICATIONS FOR PROCEDURE:  The patient, Paul Hancock is a 53 y.o. male born on Oct 29, 1970, is here for treatment of right facial mass. IV MRN: [id number]  CONSENT:  Informed consent was obtained directly from the patient. Risks, benefits and alternatives were fully discussed. Specific risks including but not limited to bleeding, infection, hematoma, seroma, scarring, pain, infection, contracture, asymmetry, wound healing problems, and need for further surgery were all discussed. The patient did have an ample opportunity to have questions answered to satisfaction.   DESCRIPTION OF PROCEDURE:  The patient was taken to the operating room. SCDs were placed and IV antibiotics were given. The patient's operative site was prepped and draped in a sterile fashion. A time out was performed and all information was confirmed to be correct.  Mix of lidocaine 1% with epinephrine and Marcaine 0.25% anesthesia was administered.  An incision was made over the mass and the mass was circumferentially dissected using iris scissors. The mass was very friable, the whole capsule was excised.  The SMAs was visualized on the wound bed and was intact. Irrigation was perfomed and hemostasis was achieved. The wound was closed with a deep layer of 3-0 Monocryl and a superficial layer of 5-0 nylon. The patient tolerated the procedure well.  There were no complications. The patient was allowed to wake from anesthesia, extubated and taken to the recovery room in satisfactory condition.   The advanced practice practitioner (APP) assisted throughout the case.  The APP was  essential in retraction and counter traction when needed to make the case progress smoothly.  This retraction and assistance made it possible to see the tissue plans for the procedure.  The assistance was needed for blood control, tissue re-approximation and assisted with closure of the incision site.   [name], MD [facility]

## 2024-06-04 NOTE — Anesthesia Preprocedure Evaluation (Addendum)
 Anesthesia Evaluation  Patient identified by MRN, date of birth, ID band Patient awake    Reviewed: Allergy & Precautions, NPO status , Patient's Chart, lab work & pertinent test results  Airway Mallampati: I  TM Distance: >3 FB Neck ROM: Full    Dental  (+) Teeth Intact, Dental Advisory Given   Pulmonary neg pulmonary ROS   breath sounds clear to auscultation       Cardiovascular negative cardio ROS  Rhythm:Regular Rate:Normal     Neuro/Psych negative neurological ROS  negative psych ROS   GI/Hepatic negative GI ROS, Neg liver ROS,,,  Endo/Other  negative endocrine ROS    Renal/GU negative Renal ROS     Musculoskeletal negative musculoskeletal ROS (+)    Abdominal   Peds  Hematology negative hematology ROS (+)   Anesthesia Other Findings   Reproductive/Obstetrics                              Anesthesia Physical Anesthesia Plan  ASA: 2  Anesthesia Plan: General   Post-op Pain Management: Tylenol PO (pre-op)* and Toradol  IV (intra-op)*   Induction: Intravenous  PONV Risk Score and Plan: 3 and Ondansetron, Dexamethasone and Midazolam  Airway Management Planned: LMA and Oral ETT  Additional Equipment: None  Intra-op Plan:   Post-operative Plan: Extubation in OR  Informed Consent: I have reviewed the patients History and Physical, chart, labs and discussed the procedure including the risks, benefits and alternatives for the proposed anesthesia with the patient or authorized representative who has indicated his/her understanding and acceptance.     Dental advisory given  Plan Discussed with: CRNA  Anesthesia Plan Comments:          Anesthesia Quick Evaluation

## 2024-06-04 NOTE — Anesthesia Postprocedure Evaluation (Signed)
 Anesthesia Post Note  Patient: Paul Hancock  Procedure(s) Performed: EXCISION, MASS, HEAD (Right: Face)     Patient location during evaluation: PACU Anesthesia Type: General Level of consciousness: awake and alert Pain management: pain level controlled Vital Signs Assessment: post-procedure vital signs reviewed and stable Respiratory status: spontaneous breathing, nonlabored ventilation, respiratory function stable and patient connected to nasal cannula oxygen Cardiovascular status: blood pressure returned to baseline and stable Postop Assessment: no apparent nausea or vomiting Anesthetic complications: no   No notable events documented.  Last Vitals:  Vitals:   06/04/24 1245 06/04/24 1300  BP: 113/62 108/63  Pulse: (!) 53 (!) 47  Resp: 17 18  Temp:    SpO2: 99% 98%    Last Pain:  Vitals:   06/04/24 1300  TempSrc:   PainSc: 0-No pain                 Paul Hancock

## 2024-06-04 NOTE — Discharge Instructions (Addendum)
 Activity (include date of return to work if known) As tolerated: NO showers NO driving No heavy activities May return to work on light duty in 5-7 days  Diet:regular  Wound Care: Keep dressing clean & dry. Remove dressing in 48 hours and apply bacitracin BID for 7 days.   Special Instructions: Call Doctor if any unusual problems occur such as pain, excessive Bleeding, unrelieved Nausea/vomiting, Fever &/or chills When lying down, keep head elevated on 2-3 pillows or back-rest Follow-up appointment: Please call the office.  The patient received discharge instruction from:___________________________________________   Patient signature ________________________________________ / Date___________    Signature of individual providing instructions/ Date________________                Post Anesthesia Home Care Instructions  Activity: Get plenty of rest for the remainder of the day. A responsible individual must stay with you for 24 hours following the procedure.  For the next 24 hours, DO NOT: -Drive a car -Advertising copywriter -Drink alcoholic beverages -Take any medication unless instructed by your physician -Make any legal decisions or sign important papers.  Meals: Start with liquid foods such as gelatin or soup. Progress to regular foods as tolerated. Avoid greasy, spicy, heavy foods. If nausea and/or vomiting occur, drink only clear liquids until the nausea and/or vomiting subsides. Call your physician if vomiting continues.  Special Instructions/Symptoms: Your throat may feel dry or sore from the anesthesia or the breathing tube placed in your throat during surgery. If this causes discomfort, gargle with warm salt water. The discomfort should disappear within 24 hours.  If you had a scopolamine patch placed behind your ear for the management of post- operative nausea and/or vomiting:  1. The medication in the patch is effective for 72 hours, after which it should be removed.   Wrap patch in a tissue and discard in the trash. Wash hands thoroughly with soap and water. 2. You may remove the patch earlier than 72 hours if you experience unpleasant side effects which may include dry mouth, dizziness or visual disturbances. 3. Avoid touching the patch. Wash your hands with soap and water after contact with the patch.  No tylenol until after 4:44pm

## 2024-06-05 ENCOUNTER — Encounter (HOSPITAL_BASED_OUTPATIENT_CLINIC_OR_DEPARTMENT_OTHER): Payer: Self-pay

## 2024-06-05 LAB — SURGICAL PATHOLOGY

## 2024-06-11 ENCOUNTER — Telehealth: Payer: Self-pay

## 2024-06-11 NOTE — Telephone Encounter (Signed)
 Patient says incision site is oozing and itching, he would  to know if he can leave the bandage off please advise

## 2024-06-14 ENCOUNTER — Encounter

## 2024-06-14 ENCOUNTER — Ambulatory Visit

## 2024-06-14 VITALS — BP 125/65 | HR 64 | Ht 69.0 in | Wt 183.0 lb

## 2024-06-14 DIAGNOSIS — Z9889 Other specified postprocedural states: Secondary | ICD-10-CM

## 2024-06-14 DIAGNOSIS — Z09 Encounter for follow-up examination after completed treatment for conditions other than malignant neoplasm: Secondary | ICD-10-CM

## 2024-06-14 NOTE — Progress Notes (Signed)
   Established Patient Office Visit  Subjective   Patient ID: Paul Hancock, male    DOB: [birth year]  Age: 53 y.o. 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  No signs of cellulitis. Otherwise doing well no pain or any other issues.   Pathology report discussed with patient.     Objective:     BP 125/65   Pulse 64   Ht 5' 9 (1.753 m)   Wt 183 lb (83 kg)   SpO2 100%   BMI 27.02 kg/m  BP Readings from Last 3 Encounters:  06/14/24 125/65  06/04/24 117/75  05/18/24 117/73      Physical Exam Face: Thick layer of dry yellow bacitracin on incision, removed today, raw irritated skin below the area. Stitches removed.    Last CBC Lab Results  Component Value Date   HGB 16.3 09/15/2014   HCT 48.0 09/15/2014   Last metabolic panel Lab Results  Component Value Date   GLUCOSE 85 09/15/2014   NA 140 09/15/2014   K 3.7 09/15/2014   CL 101 09/15/2014   BUN 21 09/15/2014   CREATININE 1.30 09/15/2014      The ASCVD Risk score (Arnett DK, et al., 2019) failed to calculate for the following reasons:   Cannot find a previous HDL lab   Cannot find a previous total cholesterol lab    Assessment & Plan:   Problem List Items Addressed This Visit   None Visit Diagnoses       Follow-up exam    -  Primary      Stop bacitracin, triple antibiotic cream for 3 days. Clean face daily with hypoallergenic soap.  Follow up with PA for skin check to make sure he is healing well next week.  Follow up with me in 3 months. Instructed to call the office if any questions or concerns.  Rainn Zupko M Jden Want, MD

## 2024-06-22 ENCOUNTER — Ambulatory Visit (INDEPENDENT_AMBULATORY_CARE_PROVIDER_SITE_OTHER): Admitting: Surgical

## 2024-06-22 VITALS — BP 112/62 | HR 64 | Ht 69.0 in | Wt 186.4 lb

## 2024-06-22 DIAGNOSIS — D2330 Other benign neoplasm of skin of unspecified part of face: Secondary | ICD-10-CM

## 2024-06-22 DIAGNOSIS — Z09 Encounter for follow-up examination after completed treatment for conditions other than malignant neoplasm: Secondary | ICD-10-CM

## 2024-06-22 NOTE — Progress Notes (Signed)
 Patient is a 53 year old male here for follow-up after excision of cyst of his right jaw with Dr. Montorfano on 06/04/2024.  Patient had some postoperative irritation from thick layer of dry yellow bacitracin.  He subsequently had some raw irritated skin.  This has healed since his last appointment.  He did use some triamcinolone  steroid cream prescribed by his PCP for short period of time.  On exam right jaw incision is intact and healed well.  He does have some hyperpigmentation.  There is no erythema or cellulitic changes.  There is no active drainage.  I do not appreciate any fluid collections   A/P:  Recommend avoiding continuing to use steroid cream.  Discussed the importance of use of sunscreen, minimum SPF 30 to decrease risk of ongoing hyperpigmentation.  We discussed continuing to monitor the area.  Recommend following up in 3 months with Dr. Montorfano for reevaluation.  Pictures were obtained of the patient and placed in the chart with the patient's or guardian's permission.

## 2024-06-27 ENCOUNTER — Encounter: Admitting: Student

## 2024-09-21 ENCOUNTER — Encounter: Admitting: Student
